# Patient Record
Sex: Female | Born: 1935 | Race: White | Hispanic: No | Marital: Married | Smoking: Current every day smoker
Health system: Southern US, Community
[De-identification: ages and names within clinical notes are randomized; demographics above are authoritative.]

## PROBLEM LIST (undated history)

## (undated) DIAGNOSIS — I6529 Occlusion and stenosis of unspecified carotid artery: Secondary | ICD-10-CM

## (undated) DIAGNOSIS — I639 Cerebral infarction, unspecified: Secondary | ICD-10-CM

## (undated) DIAGNOSIS — M8580 Other specified disorders of bone density and structure, unspecified site: Secondary | ICD-10-CM

## (undated) DIAGNOSIS — K219 Gastro-esophageal reflux disease without esophagitis: Secondary | ICD-10-CM

## (undated) DIAGNOSIS — R0602 Shortness of breath: Secondary | ICD-10-CM

## (undated) DIAGNOSIS — E785 Hyperlipidemia, unspecified: Secondary | ICD-10-CM

## (undated) DIAGNOSIS — I10 Essential (primary) hypertension: Secondary | ICD-10-CM

## (undated) DIAGNOSIS — J449 Chronic obstructive pulmonary disease, unspecified: Secondary | ICD-10-CM

## (undated) DIAGNOSIS — R51 Headache: Secondary | ICD-10-CM

## (undated) DIAGNOSIS — L309 Dermatitis, unspecified: Secondary | ICD-10-CM

## (undated) HISTORY — DX: Chronic obstructive pulmonary disease, unspecified: J44.9

## (undated) HISTORY — PX: CATARACT EXTRACTION: SUR2

## (undated) HISTORY — DX: Essential (primary) hypertension: I10

## (undated) HISTORY — PX: DILATION AND CURETTAGE OF UTERUS: SHX78

## (undated) HISTORY — DX: Dermatitis, unspecified: L30.9

## (undated) HISTORY — DX: Occlusion and stenosis of unspecified carotid artery: I65.29

## (undated) HISTORY — PX: CERVICAL FUSION: SHX112

## (undated) HISTORY — PX: ABDOMINAL HYSTERECTOMY: SHX81

## (undated) HISTORY — DX: Hyperlipidemia, unspecified: E78.5

## (undated) HISTORY — DX: Cerebral infarction, unspecified: I63.9

## (undated) HISTORY — PX: CATARACT EXTRACTION W/ INTRAOCULAR LENS  IMPLANT, BILATERAL: SHX1307

## (undated) HISTORY — DX: Gastro-esophageal reflux disease without esophagitis: K21.9

## (undated) HISTORY — PX: BREAST SURGERY: SHX581

## (undated) HISTORY — DX: Other specified disorders of bone density and structure, unspecified site: M85.80

---

## 2008-01-25 ENCOUNTER — Ambulatory Visit: Payer: Self-pay | Admitting: Gastroenterology

## 2008-01-26 ENCOUNTER — Ambulatory Visit (HOSPITAL_COMMUNITY): Admission: RE | Admit: 2008-01-26 | Discharge: 2008-01-26 | Payer: Self-pay | Admitting: Gastroenterology

## 2008-01-26 ENCOUNTER — Ambulatory Visit: Payer: Self-pay | Admitting: Gastroenterology

## 2008-01-26 ENCOUNTER — Encounter: Payer: Self-pay | Admitting: Gastroenterology

## 2010-03-05 ENCOUNTER — Encounter: Admission: RE | Admit: 2010-03-05 | Discharge: 2010-03-05 | Payer: Self-pay | Admitting: General Surgery

## 2010-04-08 ENCOUNTER — Ambulatory Visit (HOSPITAL_COMMUNITY): Admission: RE | Admit: 2010-04-08 | Discharge: 2010-04-08 | Payer: Self-pay | Admitting: General Surgery

## 2010-10-01 LAB — BASIC METABOLIC PANEL
BUN: 12 mg/dL (ref 6–23)
CO2: 29 mEq/L (ref 19–32)
GFR calc Af Amer: >60 mL/min (ref >60)
Glucose, Bld: 95 mg/dL (ref 70–99)
Potassium: 4 mEq/L (ref 3.5–5.1)

## 2010-10-01 LAB — CBC
MCHC: 33.6 g/dL (ref 30.0–36.0)
RBC: 4.84 MIL/uL (ref 3.87–5.11)
RDW: 14.5 % (ref 11.5–15.5)
WBC: 7.6 10*3/uL (ref 4.0–10.5)

## 2010-10-01 LAB — SURGICAL PCR SCREEN
MRSA, PCR: NEGATIVE
Staphylococcus aureus: NEGATIVE

## 2010-12-01 NOTE — Consult Note (Signed)
Virginia Fleming, Virginia Fleming             ACCOUNT NO.:  [id number]   MEDICAL RECORD NO.:  [id number]          PATIENT TYPE:  AMB   LOCATION:  DAY                           FACILITY:  [facility]   PHYSICIAN:  Kassie Mends, M.D.      DATE OF BIRTH:  Jun 06, 1933   DATE OF CONSULTATION:  01/25/2008  DATE OF DISCHARGE:                                 CONSULTATION   REQUESTING PHYSICIAN:  Dr. Sherryll Burger.   CHIEF COMPLAINT:  Rectal bleeding.   HISTORY OF PRESENT ILLNESS:  Virginia Fleming is a 77 year old Caucasian  female; 4 days ago, she awakened at 3 a.m. with a severe abdominal  aching pain and nausea.  She went to the bathroom and she became  diaphoretic and then she had cold and chills.  She had a large amount of  bright red blood, which she passed in the commode.  It continued on for  approximately 24 hours and she has not seen any further bleeding since.  She did not have a bowel movement during this time.  She denies any  NSAID use.  She continues to have some mild aching pain to her abdomen.  Denies any heartburn, indigestion, dysphagia, odynophagia, anorexia or  early satiety.  Denies any fever or chills.  Denies any history of  constipation or diarrhea.  Her weight has remained stable.   PAST MEDICAL AND SURGICAL HISTORY:  1. She has had multiple EGDs by Dr. Linna Darner and tells me that they have      all previously been normal with history of chronic GERD.  2. She had a colonoscopy by Dr. Linna Darner, which she believes was over 5      years or so ago and showed polyps.  3. She has had left wrist surgery, right breast lumpectomy, complete      hysterectomy, and cervical disk surgery.   CURRENT MEDICATIONS:  Kapadex 60 mg daily.   ALLERGIES:  PENICILLIN and CODEINE.   FAMILY HISTORY:  Positive for mother deceased with pancreatic cancer at  age 46.  Father deceased, secondary to prostate carcinoma.  She has 3  daughters with diabetes mellitus, 1 who had an history of peptic ulcer  disease, and 1 with  leukemia.  Two sisters, 1 with breast cancer and  heart disease.  Two sisters living, 1 deceased with heart disease as  well as a brother with the same.   SOCIAL HISTORY:  Virginia Fleming is married.  She has 4 daughters.  She is  retired from [employer].  She has a 40-year history of smoking  approximately of half-a-pack of cigarettes a day.  Denies any alcohol or  drug use.   REVIEW OF SYSTEMS:  See HPI, otherwise, negative.   PHYSICAL EXAMINATION:  VITAL SIGNS:  Weight 138.5 pounds, height 64  inches, temperature 98.2, and blood pressure 114/78, and pulse 80.  GENERAL:  She is a well-developed, well-nourished Caucasian female, in  no acute distress.  HEENT:  Sclerae clear, nonicteric.  Conjunctiva pink.  Oropharynx pink  and moist without lesions.  NECK:  Supple without mass or thyromegaly.  CHEST:  Heart regular  rhythm.  Normal S1-S2 without no murmurs, clicks,  rubs, or gallops.  ABDOMEN:  Positive bowel sounds x4.  No bruits auscultated.  Soft,  nontender, nondistended without palpable mass or hepatosplenomegaly  without any tenderness or guarding.  EXTREMITIES:  Without clubbing or edema bilaterally.  SKIN:  Pink, warm, and dry.   IMPRESSION:  Virginia Fleming is a 77 year old Caucasian female with acute  onset of abdominal pain and rectal bleeding, which I suspect this is due  to ischemic colitis.  Less likely would be diverticular bleeding or  colorectal carcinoma.  Benign anorectal source such as hemorrhoids or  fissures is a very unlikely given her presentation.  History of chronic  gastroesophageal reflux disease, well controlled on proton pump  inhibitor.   PLAN:  1. Colonoscopy with Dr. Cira Servant in the near future.  I discussed the      procedure including risks and benefits, which include but not      limited to bleeding, infection, perforation, and drug reaction.      She agrees with the plan and consent will be obtained.  2. The plan will be to continued Kapadex 60 mg  daily.   Thank you Dr. Sherryll Burger for allowing [place] to participate in the care of Virginia Fleming.      Lorenza Burton, N.P.      Kassie Mends, M.D.  Electronically Signed    KJ/MEDQ  D:  01/25/2008  T:  01/26/2008  Job:  [id number]   cc:   Kirstie Peri, MD  Fax: [phone]

## 2010-12-01 NOTE — Op Note (Signed)
[name], [name]             ACCOUNT NO.:  [id number]   MEDICAL RECORD NO.:  [id number]          PATIENT TYPE:  AMB   LOCATION:  DAY                           FACILITY:  [facility]   PHYSICIAN:  Virginia Fleming, M.D.      DATE OF BIRTH:  09-20-1932   DATE OF PROCEDURE:  01/26/2008  DATE OF DISCHARGE:                               OPERATIVE REPORT   REFERRING PHYSICIAN:  Kirstie Peri, MD   PROCEDURE:  Ileocolonoscopy with cold forceps biopsy and cold forceps  polypectomy.   INDICATION FOR EXAM:  Virginia Fleming is a 77 year old female who presented  with a sudden onset of abdominal pain followed by profuse rectal  bleeding on Sunday.  She has not seen any continued bleeding since.  She  is currently not having any abdominal pain.  She reports having a  colonoscopy 5 years ago, which showed polyps. She complains of pain in  her abdomen after eating frequently.  She denies any weight loss.   FINDINGS:  1. 3-mm ulcers seen in the sigmoid colon, ascending colon, and cecum.      Biopsies obtained via cold forceps to evaluate for inflammatory      bowel disease.  2. Area of erythema seen at the splenic flexure and with occasional      erosion.  Biopsies obtained via cold forceps to evaluate for      inflammatory bowel disease or ischemic colitis.  3. A 3-mm sigmoid colon polyp and 4-mm rectal polyp removed via cold      forceps.  4. Frequent sigmoid colon diverticula.  5. Small internal hemorrhoids.  Otherwise, no masses or AVMs.  6. Normal distal terminal ileum approximately 10-20 cm visualized.   DIAGNOSES:  1. Multiple colon ulcers likely secondary to prep artifact.  2. Erythema and erosion in the splenic flexure most likely secondary      to ischemic colitis in this patient, who has a history of tobacco      abuse.  3. Mild diverticulosis.  4. Small internal hemorrhoids.   RECOMMENDATIONS:  1. She should begin aspirin 81 mg daily on February 01, 2008.  She should      see her regular  doctor for ideal management for blood pressure and      cholesterol.  2. She should continue her Kapidex.  3. She should follow a high-fiber diet.  She is given a handout on      high-fiber diet, polyps, diverticulosis, and hemorrhoid.  4. No aspirin, NSAIDs, or anticoagulation for 5 days.  5. Follow up appointment with Dr. Cira Fleming in 6 weeks regarding her      abdominal pain.  6. Will call Virginia Fleming with results of her biopsies.  If her      biopsies are consistent with ischemic colitis, then she will need      either a CT scan of the abdomen and pelvis or a CT angiography of      the abdomen.   MEDICATIONS:  1. Demerol 50 mg IV.  2. Versed 4 mg IV.   PROCEDURE TECHNIQUE:  Physical exam was performed.  Informed consent was  obtained from the patient explaining the benefits, risks, and  alternatives to the procedure.  The patient was connected to the monitor  and placed in left lateral position.  Continuous oxygen was provided by  nasal cannula, IV medicine administered through an indwelling cannula.  After administration of sedation and rectal exam, the patient's rectum  was intubated, and the scope was advanced under direct visualization to  the distal terminal ileum.  The scope was removed slowly by carefully  examining the color, texture, anatomy, and integrity of the mucosa on  the way out.  The patient was recovered in endoscopy and discharged home  in satisfactory condition.   PATH:  Ulcers: Ischemic, IBD, or NSAID induced. Splenic flexure:  ischemic colitis. Hyperplastic polyps. Risk factor modification. Add  aspirin. Stop smoking. High fiber diet. TCS in 10 years.      Virginia Fleming, M.D.  Electronically Signed     SM/MEDQ  D:  01/26/2008  T:  01/27/2008  Job:  [id number]   cc:   Virginia Peri, MD  Fax: [phone]

## 2014-01-25 ENCOUNTER — Encounter: Payer: Self-pay | Admitting: Vascular Surgery

## 2014-01-25 ENCOUNTER — Other Ambulatory Visit: Payer: Self-pay

## 2014-01-25 DIAGNOSIS — Z01810 Encounter for preprocedural cardiovascular examination: Secondary | ICD-10-CM

## 2014-01-25 DIAGNOSIS — I6529 Occlusion and stenosis of unspecified carotid artery: Secondary | ICD-10-CM

## 2014-02-04 ENCOUNTER — Encounter: Payer: Self-pay | Admitting: Vascular Surgery

## 2014-02-05 ENCOUNTER — Encounter: Payer: Self-pay | Admitting: Vascular Surgery

## 2014-02-05 ENCOUNTER — Ambulatory Visit (HOSPITAL_COMMUNITY)
Admission: RE | Admit: 2014-02-05 | Discharge: 2014-02-05 | Disposition: A | Discharge status: Home / Self Care | Payer: Medicare Other | Source: Ambulatory Visit | Attending: Vascular Surgery | Admitting: Vascular Surgery

## 2014-02-05 ENCOUNTER — Ambulatory Visit (INDEPENDENT_AMBULATORY_CARE_PROVIDER_SITE_OTHER): Payer: Medicare Other | Admitting: Vascular Surgery

## 2014-02-05 VITALS — BP 142/79 | HR 84 | Resp 14 | Ht 63.0 in | Wt 137.0 lb

## 2014-02-05 DIAGNOSIS — Z01810 Encounter for preprocedural cardiovascular examination: Secondary | ICD-10-CM

## 2014-02-05 DIAGNOSIS — I6529 Occlusion and stenosis of unspecified carotid artery: Secondary | ICD-10-CM

## 2014-02-05 DIAGNOSIS — Z01818 Encounter for other preprocedural examination: Secondary | ICD-10-CM

## 2014-02-05 NOTE — Progress Notes (Signed)
[facility] [place] HISTORY AND PHYSICAL  Referring: Dr. Monico Blitz History of Present Illness:  Patient is a 80 y.o. year old female who presents for evaluation of symptomatic carotid stenosis. The patient had an uneventful in may of this year where she had approximately 30 minutes of right hand right leg weakness as well as speech difficulties. She drove herself to Dr. Trena Platt office after the symptoms resolved. She had a CT angiogram which showed 75% left internal carotid artery stenosis and 50% right internal carotid artery stenosis with bilateral vertebral artery moderate stenosis. She is currently on aspirin and Plavix. She denies any other prior TIA type episodes. She denies amaurosis. She denies prior stroke. She is a current smoker. Greater than 3 minutes they spent regarding smoking cessation counseling. Other medical problems include hyperlipidemia, hypertension, COPD, reflux all which are currently controlled. She did have a cardiac catheterization by Dr. Rex Kras several years ago which showed no obstructive coronary disease.  Past surgical history: Left cervical spine procedure through anterior approach  Past Medical History  Diagnosis Date  . Carotid artery occlusion   . Stroke   . Hyperlipidemia   . Hypertension   . COPD (chronic obstructive pulmonary disease)   . Gastroesophageal reflux disease   . Osteopenia   . Eczematous dermatitis     No past surgical history on file.  Social History History  Substance Use Topics  . Smoking status: Current Every Day Smoker  . Smokeless tobacco: Never Used  . Alcohol Use: No    Family History No family history on file.  Allergies  Allergies  Allergen Reactions  . Codeine   . Penicillins   . Propranolol Hcl   . Zithromax [Azithromycin]      Current Outpatient Prescriptions  Medication Sig Dispense Refill  . albuterol (ACCUNEB) 0.63 MG/3ML nebulizer solution Take 1 ampule by nebulization daily.       Marland Kitchen albuterol (PROVENTIL HFA;VENTOLIN HFA) 108 (90 BASE) MCG/ACT inhaler Inhale into the lungs every 6 (six) hours as needed for wheezing or shortness of breath.      Marland Kitchen aspirin 81 MG tablet Take 81 mg by mouth daily.      . clobetasol cream (TEMOVATE) 1.59 % Apply 1 application topically daily.      . clonazePAM (KLONOPIN) 0.5 MG tablet Take 0.5 mg by mouth daily.      . clopidogrel (PLAVIX) 75 MG tablet Take 75 mg by mouth daily.      . cyclobenzaprine (FLEXERIL) 10 MG tablet Take 10 mg by mouth 2 (two) times daily.      Marland Kitchen losartan (COZAAR) 50 MG tablet Take 50 mg by mouth daily. Take 1/2 tablet daily      . omeprazole (PRILOSEC) 20 MG capsule Take 20 mg by mouth daily.      . calcium carbonate (OS-CAL) 600 MG TABS tablet Take 600 mg by mouth 2 (two) times daily with a meal.       No current facility-administered medications for this visit.    ROS:   General:  No weight loss, Fever, chills  HEENT: No recent headaches, no nasal bleeding, no visual changes, no sore throat  Neurologic: No dizziness, blackouts, seizures.   Cardiac: + recent episodes of chest pain/pressure, no shortness of breath at rest.  + shortness of breath with exertion.  Denies history of atrial fibrillation or irregular heartbeat  Vascular: No history of rest pain in feet.  No history of claudication.  No history of non-healing  ulcer, No history of DVT   Pulmonary: No home oxygen, + productive cough, no hemoptysis,  No asthma or wheezing  Musculoskeletal:  [ ]  Arthritis, [ ]  Low back pain,  [ ]  Joint pain  Hematologic:No history of hypercoagulable state.  No history of easy bleeding.  No history of anemia  Gastrointestinal: No hematochezia or melena,  +gastroesophageal reflux, no trouble swallowing  Urinary: [ ]  chronic Kidney disease, [ ]  on HD - [ ]  MWF or [ ]  TTHS, [ ]  Burning with urination, [ ]  Frequent urination, [ ]  Difficulty urinating;   Skin: No rashes  Psychological: No history of anxiety,  No  history of depression   Physical Examination  Filed Vitals:   02/05/14 1121  BP: 142/79  Pulse: 84  Resp: 14  Height: 5\' 3"  (1.6 m)  Weight: 137 lb (62.143 kg)    Body mass index is 24.27 kg/(m^2).  General:  Alert and oriented, no acute distress HEENT: Normal Neck: No bruit or JVD, 3 cm transverse scar face left neck Pulmonary: Clear to auscultation bilaterally Cardiac: Regular Rate and Rhythm without murmur Abdomen: Soft, non-tender, non-distended, no mass Skin: No rash Extremity Pulses:  2+ radial, brachial, femoral, dorsalis pedis bilaterally Musculoskeletal: No deformity or edema  Neurologic: Upper and lower extremity motor 5/5 and symmetric, no facial asymmetry  DATA:  Carotid duplex scan was performed in our office today for operative planning purposes. I reviewed and interpreted this study. Left internal carotid artery stenosis of 60-80%.   ASSESSMENT:  Symptomatic left internal carotid artery stenosis   PLAN:  #1 continue antiplatelet agent Plavix/aspirin #2 cardiac risk stratification at [facility] #3 plan for left carotid endarterectomy after cardiac risk stratification  Risks benefits possible complications and procedure details of carotid endarterectomy were explained to the patient and her daughter today. Risk of stroke 1-2%. Risk of cranial nerve injury 10-15% among other possible complications. Patient understands and agrees to proceed.  Ruta Hinds, MD [facility] [place] Office: [phone] Pager: [phone]

## 2014-02-06 ENCOUNTER — Ambulatory Visit (INDEPENDENT_AMBULATORY_CARE_PROVIDER_SITE_OTHER): Payer: Medicare Other | Admitting: Cardiovascular Disease

## 2014-02-06 ENCOUNTER — Encounter: Payer: Self-pay | Admitting: *Deleted

## 2014-02-06 ENCOUNTER — Encounter: Payer: Self-pay | Admitting: Cardiovascular Disease

## 2014-02-06 VITALS — BP 120/78 | HR 78 | Ht 63.0 in

## 2014-02-06 DIAGNOSIS — I491 Atrial premature depolarization: Secondary | ICD-10-CM

## 2014-02-06 DIAGNOSIS — I10 Essential (primary) hypertension: Secondary | ICD-10-CM

## 2014-02-06 DIAGNOSIS — Z01810 Encounter for preprocedural cardiovascular examination: Secondary | ICD-10-CM

## 2014-02-06 DIAGNOSIS — I6529 Occlusion and stenosis of unspecified carotid artery: Secondary | ICD-10-CM

## 2014-02-06 DIAGNOSIS — Z01818 Encounter for other preprocedural examination: Secondary | ICD-10-CM

## 2014-02-06 NOTE — Assessment & Plan Note (Signed)
Well controlled.  Continue current medications and low sodium Dash type diet.    

## 2014-02-06 NOTE — Progress Notes (Signed)
Patient ID: Virginia Fleming, female   DOB: 1932-10-27, 80 y.o.   MRN: [id number]    80 yo previously seen by Dr Rex Kras  Normal cath in 70's  Sees Dr Brigitte Pulse in [place] for primary care  Recent TIA/CVA with high grade left carotid stenosis  Needs CEA with Dr Oneida Alar Has significant COPD and still smokes  No recent PFTls  Or CXR  No chest pain Activity limited by dyspnea  Seems under a lot of stress taking care of her husband with dementia No chest pain.  Has abnormal ECG with PAC;s  And low voltage.  CRF smoking and HTN with known vascular disease  On ASA and plavix since stroke.  Denies palpitations      ROS: Denies fever, malais, weight loss, blurry vision, decreased visual acuity, cough, sputum, SOB, hemoptysis, pleuritic pain, palpitaitons, heartburn, abdominal pain, melena, lower extremity edema, claudication, or rash.  All other systems reviewed and negative   General: Affect appropriate Chronically ill female  HEENT: normal Neck supple with no adenopathy JVP normal bilateral bruits no thyromegaly Lungs clear with no wheezing and good diaphragmatic motion Heart:  S1/S2 no murmur,rub, gallop or click PMI normal Abdomen: benighn, BS positve, no tenderness, no AAA no bruit.  No HSM or HJR Distal pulses intact with no bruits No edema Neuro non-focal Skin warm and dry No muscular weakness  Medications Current Outpatient Prescriptions  Medication Sig Dispense Refill  . albuterol (PROVENTIL HFA;VENTOLIN HFA) 108 (90 BASE) MCG/ACT inhaler Inhale into the lungs every 6 (six) hours as needed for wheezing or shortness of breath.      Marland Kitchen aspirin 81 MG tablet Take 81 mg by mouth daily.      . calcium carbonate (OS-CAL) 600 MG TABS tablet Take 600 mg by mouth 2 (two) times daily with a meal.      . clonazePAM (KLONOPIN) 0.5 MG tablet Take 0.5 mg by mouth daily.      . clopidogrel (PLAVIX) 75 MG tablet Take 75 mg by mouth daily.      . cyclobenzaprine (FLEXERIL) 10 MG tablet Take 10 mg by mouth  as needed.       Marland Kitchen losartan (COZAAR) 50 MG tablet Take 50 mg by mouth daily. Take 1/2 tablet daily      . omeprazole (PRILOSEC) 20 MG capsule Take 20 mg by mouth daily.       No current facility-administered medications for this visit.    Allergies Codeine; Penicillins; Propranolol hcl; and Zithromax  Family History: No family history on file.  Social History: History   Social History  . Marital Status: Married    Spouse Name: N/A    Number of Children: N/A  . Years of Education: N/A   Occupational History  . Not on file.   Social History Main Topics  . Smoking status: Current Every Day Smoker  . Smokeless tobacco: Never Used  . Alcohol Use: No  . Drug Use: No  . Sexual Activity: Not on file   Other Topics Concern  . Not on file   Social History Narrative  . No narrative on file    Electrocardiogram:  SR low voltage PAC;s   Assessment and Plan

## 2014-02-06 NOTE — Assessment & Plan Note (Signed)
Smoker with limited activity and known vascular disease abnormal ECG  Lexiscan myovue to r/o CAD

## 2014-02-06 NOTE — Assessment & Plan Note (Signed)
Asymptomatic  Increase risk of MAT / PAF  consier sympathomimetic beta blocker if she tolerates lexiscan She does have mild rhonchi and exp wheezing on exam

## 2014-02-06 NOTE — Assessment & Plan Note (Signed)
Continue ASA and Plavix  CEA if myovue looks ok  Consider preop CXR and PFTls as her COPD limits her more than anything

## 2014-02-06 NOTE — Patient Instructions (Signed)
Your physician recommends that you schedule a follow-up appointment in: As Needed  Your physician has requested that you have a lexiscan myoview. For further information please visit [email or web address]. Please follow instruction sheet, as given.    

## 2014-02-12 ENCOUNTER — Other Ambulatory Visit: Payer: Self-pay

## 2014-02-14 ENCOUNTER — Encounter (HOSPITAL_COMMUNITY)
Admission: RE | Admit: 2014-02-14 | Discharge: 2014-02-14 | Disposition: A | Discharge status: Home / Self Care | Payer: Medicare Other | Source: Ambulatory Visit | Attending: Cardiovascular Disease | Admitting: Cardiovascular Disease

## 2014-02-14 ENCOUNTER — Ambulatory Visit (HOSPITAL_COMMUNITY)
Admission: RE | Admit: 2014-02-14 | Discharge: 2014-02-14 | Disposition: A | Discharge status: Home / Self Care | Payer: Medicare Other | Source: Ambulatory Visit | Attending: Cardiovascular Disease | Admitting: Cardiovascular Disease

## 2014-02-14 ENCOUNTER — Encounter (HOSPITAL_COMMUNITY): Payer: Self-pay

## 2014-02-14 DIAGNOSIS — I6350 Cerebral infarction due to unspecified occlusion or stenosis of unspecified cerebral artery: Secondary | ICD-10-CM

## 2014-02-14 DIAGNOSIS — Z01810 Encounter for preprocedural cardiovascular examination: Secondary | ICD-10-CM

## 2014-02-14 DIAGNOSIS — Z01818 Encounter for other preprocedural examination: Secondary | ICD-10-CM | POA: Insufficient documentation

## 2014-02-14 MED ORDER — REGADENOSON 0.4 MG/5ML IV SOLN
INTRAVENOUS | Status: AC
  Administered 2014-02-14: 0.4 mg via INTRAVENOUS
  Filled 2014-02-14: qty 5

## 2014-02-14 MED ORDER — SODIUM CHLORIDE 0.9 % IJ SOLN
INTRAMUSCULAR | Status: AC
  Administered 2014-02-14: 10 mL via INTRAVENOUS
  Filled 2014-02-14: qty 10

## 2014-02-14 MED ORDER — TECHNETIUM TC 99M SESTAMIBI GENERIC - CARDIOLITE
30.0000 | Freq: Once | INTRAVENOUS | Status: AC | PRN
  Administered 2014-02-14: 30 via INTRAVENOUS

## 2014-02-14 MED ORDER — TECHNETIUM TC 99M SESTAMIBI - CARDIOLITE
10.0000 | Freq: Once | INTRAVENOUS | Status: AC | PRN
  Administered 2014-02-14: 10:00:00 10 via INTRAVENOUS

## 2014-02-14 MED ORDER — SODIUM CHLORIDE 0.9 % IJ SOLN
10.0000 mL | INTRAMUSCULAR | Status: DC | PRN
  Administered 2014-02-14: 10 mL via INTRAVENOUS

## 2014-02-14 MED ORDER — REGADENOSON 0.4 MG/5ML IV SOLN
0.4000 mg | Freq: Once | INTRAVENOUS | Status: AC | PRN
  Administered 2014-02-14: 0.4 mg via INTRAVENOUS

## 2014-02-14 NOTE — Progress Notes (Signed)
Stress Lab Nurses Notes - [place] 02/14/2014 Reason for doing test: Surgical Clearance Type of test: Wille Glaser Nurse performing test: Gerrit Halls, RN Nuclear Medicine Tech: Melburn Hake Echo Tech: Not Applicable MD performing test: S. McDowell/K.Purcell Nails NP Family MD: Manuella Ghazi Test explained and consent signed: Yes.   IV started: Saline lock flushed, No redness or edema and Saline lock started in radiology Symptoms: Nausea & headache Treatment/Intervention: None Reason test stopped: protocol completed After recovery IV was: Discontinued via X-ray tech and No redness or edema Patient to return to Nuc. Med at : 12:30 Patient discharged: Home Patient's Condition upon discharge was: stable Comments: During test BP 136/70 & HR 96.  Recovery BP 107/75 & HR 89.  Nausea resolved in recovery.  Continues to have headache. Geanie Cooley T

## 2014-02-15 ENCOUNTER — Encounter (HOSPITAL_COMMUNITY): Payer: Self-pay | Admitting: Pharmacy Technician

## 2014-02-18 ENCOUNTER — Encounter (HOSPITAL_COMMUNITY)
Admission: RE | Admit: 2014-02-18 | Discharge: 2014-02-18 | Disposition: A | Discharge status: Home / Self Care | Payer: Medicare Other | Source: Ambulatory Visit | Attending: Anesthesiology | Admitting: Anesthesiology

## 2014-02-18 ENCOUNTER — Encounter (HOSPITAL_COMMUNITY): Payer: Self-pay

## 2014-02-18 ENCOUNTER — Encounter (HOSPITAL_COMMUNITY)
Admission: RE | Admit: 2014-02-18 | Discharge: 2014-02-18 | Disposition: A | Discharge status: Home / Self Care | Payer: Medicare Other | Source: Ambulatory Visit | Attending: Vascular Surgery | Admitting: Vascular Surgery

## 2014-02-18 DIAGNOSIS — Z01818 Encounter for other preprocedural examination: Secondary | ICD-10-CM | POA: Diagnosis not present

## 2014-02-18 DIAGNOSIS — K219 Gastro-esophageal reflux disease without esophagitis: Secondary | ICD-10-CM | POA: Insufficient documentation

## 2014-02-18 DIAGNOSIS — Z8673 Personal history of transient ischemic attack (TIA), and cerebral infarction without residual deficits: Secondary | ICD-10-CM | POA: Diagnosis not present

## 2014-02-18 DIAGNOSIS — I10 Essential (primary) hypertension: Secondary | ICD-10-CM | POA: Insufficient documentation

## 2014-02-18 DIAGNOSIS — Z01812 Encounter for preprocedural laboratory examination: Secondary | ICD-10-CM | POA: Insufficient documentation

## 2014-02-18 DIAGNOSIS — F17200 Nicotine dependence, unspecified, uncomplicated: Secondary | ICD-10-CM | POA: Insufficient documentation

## 2014-02-18 DIAGNOSIS — J4489 Other specified chronic obstructive pulmonary disease: Secondary | ICD-10-CM | POA: Insufficient documentation

## 2014-02-18 DIAGNOSIS — E785 Hyperlipidemia, unspecified: Secondary | ICD-10-CM | POA: Insufficient documentation

## 2014-02-18 DIAGNOSIS — J449 Chronic obstructive pulmonary disease, unspecified: Secondary | ICD-10-CM | POA: Insufficient documentation

## 2014-02-18 HISTORY — DX: Headache: R51

## 2014-02-18 HISTORY — DX: Shortness of breath: R06.02

## 2014-02-18 LAB — PROTIME-INR
INR: 0.97 (ref 0.00–1.49)
PROTHROMBIN TIME: 12.9 s (ref 11.6–15.2)

## 2014-02-18 LAB — COMPREHENSIVE METABOLIC PANEL
ALBUMIN: 3.8 g/dL (ref 3.5–5.2)
ALT: 12 U/L (ref 0–35)
AST: 14 U/L (ref 0–37)
Alkaline Phosphatase: 67 U/L (ref 39–117)
Anion gap: 13 (ref 5–15)
BILIRUBIN TOTAL: 0.2 mg/dL — AB (ref 0.3–1.2)
BUN: 17 mg/dL (ref 6–23)
CHLORIDE: 105 meq/L (ref 96–112)
CO2: 25 mEq/L (ref 19–32)
CREATININE: 0.61 mg/dL (ref 0.50–1.10)
Calcium: 8.8 mg/dL (ref 8.4–10.5)
GFR calc Af Amer: >90 mL/min (ref >90)
GFR calc non Af Amer: 84 mL/min — ABNORMAL LOW (ref >90)
Glucose, Bld: 85 mg/dL (ref 70–99)
Potassium: 3.8 mEq/L (ref 3.7–5.3)
Sodium: 143 mEq/L (ref 137–147)
Total Protein: 6.9 g/dL (ref 6.0–8.3)

## 2014-02-18 LAB — CBC
HCT: 47 % — ABNORMAL HIGH (ref 36.0–46.0)
Hemoglobin: 15.1 g/dL — ABNORMAL HIGH (ref 12.0–15.0)
MCH: 30.6 pg (ref 26.0–34.0)
MCHC: 32.1 g/dL (ref 30.0–36.0)
MCV: 95.3 fL (ref 78.0–100.0)
PLATELETS: 130 10*3/uL — AB (ref 150–400)
RBC: 4.93 MIL/uL (ref 3.87–5.11)
RDW: 15.3 % (ref 11.5–15.5)
WBC: 5.9 10*3/uL (ref 4.0–10.5)

## 2014-02-18 LAB — URINALYSIS, ROUTINE W REFLEX MICROSCOPIC
BILIRUBIN URINE: NEGATIVE
Glucose, UA: NEGATIVE mg/dL
Hgb urine dipstick: NEGATIVE
Ketones, ur: NEGATIVE mg/dL
Leukocytes, UA: NEGATIVE
NITRITE: NEGATIVE
PROTEIN: NEGATIVE mg/dL
Specific Gravity, Urine: 1.02 (ref 1.005–1.030)
UROBILINOGEN UA: 0.2 mg/dL (ref 0.0–1.0)
pH: 6 (ref 5.0–8.0)

## 2014-02-18 LAB — APTT: aPTT: 24 seconds (ref 24–37)

## 2014-02-18 LAB — SURGICAL PCR SCREEN
MRSA, PCR: NEGATIVE
Staphylococcus aureus: NEGATIVE

## 2014-02-18 LAB — TYPE AND SCREEN
ABO/RH(D): A POS
ANTIBODY SCREEN: NEGATIVE

## 2014-02-18 LAB — ABO/RH: ABO/RH(D): A POS

## 2014-02-18 NOTE — Pre-Procedure Instructions (Addendum)
Virginia Fleming  02/18/2014   Your procedure is scheduled on: Monday, February 25, 2014 at 7:30 AM  Report to [facility] Admitting at 5;30 AM.  Call this number if you have problems the morning of surgery: [phone]   Remember:   Do not eat food or drink liquids after midnight Sunday February 24, 2014   Take these medicines the morning of surgery with A SIP OF WATER: Aspirin, Omeprazole (Prilosec),  If needed:Clonazepam (Klonopin) for anxiety,  albuterol (Proventil) for wheezing or shortness of breath  (bring with you the day of surgery).  Plavix as directed by your Dr.  Stop taking NSAID's ie: Ibuprofen, Advil,  Aleve, BC's, Goody's, Herbal medications, Fish Oil; stop now   Do not wear jewelry, make-up or nail polish.  Do not wear lotions, powders, or perfumes. You may NOT wear deodorant.  Do not shave 48 hours prior to surgery.  Do not bring valuables to the hospital.  [facility] is not responsible for any belongings or valuables.               Contacts, dentures or bridgework may not be worn into surgery.  Leave suitcase in the car. After surgery it may be brought to your room.  For patients admitted to the hospital, discharge time is determined by your treatment team.               Patients discharged the day of surgery will not be allowed to drive home.    Special Instructions: [facility] - Preparing for Surgery  Before surgery, you can play an important role.  Because skin is not sterile, your skin needs to be as free of germs as possible.  You can reduce the number of germs on you skin by washing with CHG (chlorahexidine gluconate) soap before surgery.  CHG is an antiseptic cleaner which kills germs and bonds with the skin to continue killing germs even after washing.  Please DO NOT use if you have an allergy to CHG or antibacterial soaps.  If your skin becomes reddened/irritated stop using the CHG and inform your nurse when you arrive at Short Stay.  Do not  shave (including legs and underarms) for at least 48 hours prior to the first CHG shower.  You may shave your face.  Please follow these instructions carefully:   1.  Shower with CHG Soap the night before surgery and the morning of Surgery.  2.  If you choose to wash your hair, wash your hair first as usual with your normal shampoo.  3.  After you shampoo, rinse your hair and body thoroughly to remove the Shampoo.  4.  Use CHG as you would any other liquid soap.  You can apply chg directly to the skin and wash gently with scrungie or a clean washcloth.  5.  Apply the CHG Soap to your body ONLY FROM THE NECK DOWN.  Do not use on open wounds or open sores.  Avoid contact with your eyes, ears, mouth and genitals (private parts).  Wash genitals (private parts) with your normal soap.  6.  Wash thoroughly, paying special attention to the area where your surgery will be performed.  7.  Thoroughly rinse your body with warm water from the neck down.  8.  DO NOT shower/wash with your normal soap after using and rinsing off the CHG Soap.  9.  Pat yourself dry with a clean towel.  10.  Wear clean pajamas.            11.  Place clean sheets on your bed the night of your first shower and do not sleep with pets.  Day of Surgery  Do not apply any lotions/deodorants the morning of surgery.  Please wear clean clothes to the hospital/surgery center.      Please read over the following fact sheets that you were given: Pain Booklet, Coughing and Deep Breathing, Blood Transfusion Information, MRSA Information and Surgical Site Infection Prevention

## 2014-02-18 NOTE — Progress Notes (Signed)
Pt currently denies being SOB (but suffers with SOB due to history of COPD) and chest pain. Pt was seen by Dr. Johnsie Cancel for surgical clearance. According to pt, she was instructed to stop Plavix on Tuesday, February 19, 2014. According to  Arbie Cookey, RN at Dr. Oneida Alar office,  pt should continue with Aspirin and take it the morning of procedure. Pt chart forwarded to [place], [place] ( anesthesia) to review for surgical clearance.

## 2014-02-19 NOTE — Progress Notes (Signed)
Anesthesia Chart Review:  Patient is a 80 year old female scheduled for left carotid endarterectomy on 02/25/14 by Dr. Oneida Alar. She had RLE weakness and dysarthria that lasted 30 minutes in 12/2013 with CTA of the neck showing 75% ICAS stenosis--confirmed with 88-41% LICAS by 6/60/63 duplex.  History includes smoking, HTN, HLD, CVA, COPD with chronic SOB, GERD, osteopenia, headaches, cervical fusion, hysterectomy. PCP is Dr. Monico Blitz in [place].  She saw cardiologist Dr. Johnsie Cancel for a preoperative evaluation on 02/06/14.  She had a low risk stress test.  By notes, I think he felt her COPD was her greater risk (over CV risk). Unfortunately, she continues to smoke.  She is not on home O2. She is on albuterol PRN.    Nuclear stress test on 02/14/14 showed: Low risk Lexiscan Cardiolite. There were no diagnostic ST segment  changes, rare PVCs noted. Patient tolerated Lexiscan infusion relatively well. There were no significant perfusion defects noted at stress to suggest scar or ischemia. LVEF calculated at 54% with normal volumes and wall motion.   EKG on 02/06/14 showed: SR with PACs, low voltage QRS.  CTA of the neck on 01/04/14 [facility]) showed: Diffuse atherosclerotic disease. 75% stenosis origin of the left internal carotid artery due to calcified plaque. 50% diameter stenosis proximal right internal carotid artery and 90% stenosis proximal right external carotid artery. Moderate stenosis of the vertebral artery bilaterally.  CXR on 02/18/14 showed: Cardiomediastinal silhouette is stable. No acute infiltrate or pleural effusion. No pulmonary edema. Chronic mild interstitial prominence and mild bronchitic changes. Mild degenerative changes mid thoracic spine. IMPRESSION: No active disease. Chronic mild interstitial prominence and central mild bronchitic changes.  Preoperative labs noted.   Further evaluation on the day of surgery to ensure no acute changes in her cardiopulmonary status.  She denied any current  SOB at her PAT visit. If no acute changes then I would anticipate that she could proceed as planned.  George Hugh [facility] Short Stay Center/Anesthesiology Phone [phone] 02/19/2014 11:58 AM

## 2014-02-24 MED ORDER — SODIUM CHLORIDE 0.9 % IV SOLN: INTRAVENOUS | Status: DC

## 2014-02-24 MED ORDER — VANCOMYCIN HCL IN DEXTROSE 1-5 GM/200ML-% IV SOLN
1000.0000 mg | INTRAVENOUS | Status: AC
  Administered 2014-02-25: 1000 mg via INTRAVENOUS
  Filled 2014-02-24: qty 200

## 2014-02-25 ENCOUNTER — Encounter (HOSPITAL_COMMUNITY): Payer: Self-pay | Admitting: *Deleted

## 2014-02-25 ENCOUNTER — Encounter (HOSPITAL_COMMUNITY)
Admission: RE | Disposition: A | Discharge status: Home / Self Care | Payer: Self-pay | Source: Ambulatory Visit | Attending: Vascular Surgery

## 2014-02-25 ENCOUNTER — Encounter (HOSPITAL_COMMUNITY): Payer: Medicare Other | Admitting: Vascular Surgery

## 2014-02-25 ENCOUNTER — Inpatient Hospital Stay (HOSPITAL_COMMUNITY)
Admission: RE | Admit: 2014-02-25 | Discharge: 2014-02-26 | DRG: 039 | Disposition: A | Discharge status: Home / Self Care | Payer: Medicare Other | Source: Ambulatory Visit | Attending: Vascular Surgery | Admitting: Vascular Surgery

## 2014-02-25 ENCOUNTER — Inpatient Hospital Stay (HOSPITAL_COMMUNITY): Payer: Medicare Other | Admitting: Certified Registered Nurse Anesthetist

## 2014-02-25 DIAGNOSIS — F17200 Nicotine dependence, unspecified, uncomplicated: Secondary | ICD-10-CM | POA: Diagnosis present

## 2014-02-25 DIAGNOSIS — I6521 Occlusion and stenosis of right carotid artery: Secondary | ICD-10-CM

## 2014-02-25 DIAGNOSIS — M899 Disorder of bone, unspecified: Secondary | ICD-10-CM | POA: Diagnosis present

## 2014-02-25 DIAGNOSIS — Z880 Allergy status to penicillin: Secondary | ICD-10-CM | POA: Diagnosis not present

## 2014-02-25 DIAGNOSIS — I10 Essential (primary) hypertension: Secondary | ICD-10-CM | POA: Diagnosis present

## 2014-02-25 DIAGNOSIS — Z881 Allergy status to other antibiotic agents status: Secondary | ICD-10-CM | POA: Diagnosis not present

## 2014-02-25 DIAGNOSIS — Z7902 Long term (current) use of antithrombotics/antiplatelets: Secondary | ICD-10-CM

## 2014-02-25 DIAGNOSIS — Z79899 Other long term (current) drug therapy: Secondary | ICD-10-CM

## 2014-02-25 DIAGNOSIS — J4489 Other specified chronic obstructive pulmonary disease: Secondary | ICD-10-CM | POA: Diagnosis present

## 2014-02-25 DIAGNOSIS — E785 Hyperlipidemia, unspecified: Secondary | ICD-10-CM | POA: Diagnosis present

## 2014-02-25 DIAGNOSIS — Z885 Allergy status to narcotic agent status: Secondary | ICD-10-CM

## 2014-02-25 DIAGNOSIS — M949 Disorder of cartilage, unspecified: Secondary | ICD-10-CM | POA: Diagnosis present

## 2014-02-25 DIAGNOSIS — Z888 Allergy status to other drugs, medicaments and biological substances status: Secondary | ICD-10-CM | POA: Diagnosis not present

## 2014-02-25 DIAGNOSIS — I6529 Occlusion and stenosis of unspecified carotid artery: Secondary | ICD-10-CM | POA: Diagnosis present

## 2014-02-25 DIAGNOSIS — K219 Gastro-esophageal reflux disease without esophagitis: Secondary | ICD-10-CM | POA: Diagnosis present

## 2014-02-25 DIAGNOSIS — Z7982 Long term (current) use of aspirin: Secondary | ICD-10-CM

## 2014-02-25 DIAGNOSIS — J449 Chronic obstructive pulmonary disease, unspecified: Secondary | ICD-10-CM | POA: Diagnosis not present

## 2014-02-25 HISTORY — PX: ENDARTERECTOMY: SHX5162

## 2014-02-25 SURGERY — ENDARTERECTOMY, CAROTID
Anesthesia: General | Site: Neck | Laterality: Left

## 2014-02-25 MED ORDER — CETYLPYRIDINIUM CHLORIDE 0.05 % MT LIQD
7.0000 mL | Freq: Two times a day (BID) | OROMUCOSAL | Status: DC
  Administered 2014-02-25 – 2014-02-26 (×2): 7 mL via OROMUCOSAL

## 2014-02-25 MED ORDER — CHLORHEXIDINE GLUCONATE CLOTH 2 % EX PADS: 6.0000 | MEDICATED_PAD | Freq: Once | CUTANEOUS | Status: DC

## 2014-02-25 MED ORDER — LACTATED RINGERS IV SOLN
INTRAVENOUS | Status: DC | PRN
Start: 2014-02-25 — End: 2014-02-25
  Administered 2014-02-25 (×2): via INTRAVENOUS

## 2014-02-25 MED ORDER — PROPOFOL 10 MG/ML IV BOLUS
INTRAVENOUS | Status: DC | PRN
  Administered 2014-02-25: 80 mg via INTRAVENOUS

## 2014-02-25 MED ORDER — PHENOL 1.4 % MT LIQD: 1.0000 | OROMUCOSAL | Status: DC | PRN

## 2014-02-25 MED ORDER — ROCURONIUM BROMIDE 50 MG/5ML IV SOLN
INTRAVENOUS | Status: AC
  Filled 2014-02-25: qty 1

## 2014-02-25 MED ORDER — SODIUM CHLORIDE 0.9 % IV SOLN: 500.0000 mL | Freq: Once | INTRAVENOUS | Status: AC | PRN

## 2014-02-25 MED ORDER — MIDAZOLAM HCL 2 MG/2ML IJ SOLN
INTRAMUSCULAR | Status: AC
  Filled 2014-02-25: qty 2

## 2014-02-25 MED ORDER — VANCOMYCIN HCL IN DEXTROSE 1-5 GM/200ML-% IV SOLN
1000.0000 mg | Freq: Once | INTRAVENOUS | Status: AC
  Administered 2014-02-25: 1000 mg via INTRAVENOUS
  Filled 2014-02-25: qty 200

## 2014-02-25 MED ORDER — DEXAMETHASONE SODIUM PHOSPHATE 4 MG/ML IJ SOLN
INTRAMUSCULAR | Status: AC
  Filled 2014-02-25: qty 2

## 2014-02-25 MED ORDER — ONDANSETRON HCL 4 MG/2ML IJ SOLN
INTRAMUSCULAR | Status: DC | PRN
Start: 2014-02-25 — End: 2014-02-25
  Administered 2014-02-25: 4 mg via INTRAVENOUS

## 2014-02-25 MED ORDER — FENTANYL CITRATE 0.05 MG/ML IJ SOLN
INTRAMUSCULAR | Status: AC
  Filled 2014-02-25: qty 5

## 2014-02-25 MED ORDER — DOPAMINE-DEXTROSE 3.2-5 MG/ML-% IV SOLN: 3.0000 ug/kg/min | INTRAVENOUS | Status: DC

## 2014-02-25 MED ORDER — MAGNESIUM SULFATE 40 MG/ML IJ SOLN: 2.0000 g | Freq: Every day | INTRAMUSCULAR | Status: DC | PRN

## 2014-02-25 MED ORDER — HYDROMORPHONE HCL PF 1 MG/ML IJ SOLN
INTRAMUSCULAR | Status: AC
  Filled 2014-02-25: qty 1

## 2014-02-25 MED ORDER — 0.9 % SODIUM CHLORIDE (POUR BTL) OPTIME
TOPICAL | Status: DC | PRN
  Administered 2014-02-25: 2000 mL

## 2014-02-25 MED ORDER — DEXTROSE-NACL 5-0.45 % IV SOLN
INTRAVENOUS | Status: DC
  Administered 2014-02-25: 50 mL/h via INTRAVENOUS

## 2014-02-25 MED ORDER — LIDOCAINE HCL 4 % MT SOLN
OROMUCOSAL | Status: DC | PRN
  Administered 2014-02-25: 4 mL via TOPICAL

## 2014-02-25 MED ORDER — OXYCODONE HCL 5 MG/5ML PO SOLN: 5.0000 mg | Freq: Once | ORAL | Status: DC | PRN

## 2014-02-25 MED ORDER — SODIUM CHLORIDE 0.9 % IR SOLN
Status: DC | PRN
  Administered 2014-02-25: 08:00:00

## 2014-02-25 MED ORDER — HYDROMORPHONE HCL PF 1 MG/ML IJ SOLN
0.2500 mg | INTRAMUSCULAR | Status: DC | PRN
  Administered 2014-02-25: 0.25 mg via INTRAVENOUS

## 2014-02-25 MED ORDER — LIDOCAINE HCL (PF) 1 % IJ SOLN
INTRAMUSCULAR | Status: AC
  Filled 2014-02-25: qty 30

## 2014-02-25 MED ORDER — HEPARIN SODIUM (PORCINE) 1000 UNIT/ML IJ SOLN
INTRAMUSCULAR | Status: AC
  Filled 2014-02-25: qty 1

## 2014-02-25 MED ORDER — PROPOFOL 10 MG/ML IV BOLUS
INTRAVENOUS | Status: AC
  Filled 2014-02-25: qty 20

## 2014-02-25 MED ORDER — NEOSTIGMINE METHYLSULFATE 10 MG/10ML IV SOLN
INTRAVENOUS | Status: AC
  Filled 2014-02-25: qty 2

## 2014-02-25 MED ORDER — HEPARIN SODIUM (PORCINE) 1000 UNIT/ML IJ SOLN
INTRAMUSCULAR | Status: DC | PRN
  Administered 2014-02-25: 3000 [IU] via INTRAVENOUS
  Administered 2014-02-25: 7000 [IU] via INTRAVENOUS

## 2014-02-25 MED ORDER — CALCIUM CARBONATE 600 MG PO TABS
600.0000 mg | ORAL_TABLET | Freq: Every day | ORAL | Status: DC
  Administered 2014-02-26: 600 mg via ORAL
  Filled 2014-02-25 (×2): qty 1

## 2014-02-25 MED ORDER — LOSARTAN POTASSIUM 50 MG PO TABS
50.0000 mg | ORAL_TABLET | Freq: Every day | ORAL | Status: DC
  Administered 2014-02-26: 50 mg via ORAL
  Filled 2014-02-25: qty 1

## 2014-02-25 MED ORDER — ROCURONIUM BROMIDE 100 MG/10ML IV SOLN
INTRAVENOUS | Status: DC | PRN
  Administered 2014-02-25: 15 mg via INTRAVENOUS
  Administered 2014-02-25: 35 mg via INTRAVENOUS

## 2014-02-25 MED ORDER — LIDOCAINE HCL (CARDIAC) 20 MG/ML IV SOLN
INTRAVENOUS | Status: DC | PRN
  Administered 2014-02-25: 60 mg via INTRAVENOUS

## 2014-02-25 MED ORDER — GUAIFENESIN-DM 100-10 MG/5ML PO SYRP: 15.0000 mL | ORAL_SOLUTION | ORAL | Status: DC | PRN

## 2014-02-25 MED ORDER — PHENYLEPHRINE HCL 10 MG/ML IJ SOLN
10.0000 mg | INTRAVENOUS | Status: DC | PRN
  Administered 2014-02-25: 20 ug/min via INTRAVENOUS

## 2014-02-25 MED ORDER — LIDOCAINE HCL (CARDIAC) 20 MG/ML IV SOLN
INTRAVENOUS | Status: AC
  Filled 2014-02-25: qty 10

## 2014-02-25 MED ORDER — ALBUTEROL SULFATE HFA 108 (90 BASE) MCG/ACT IN AERS: 2.0000 | INHALATION_SPRAY | Freq: Four times a day (QID) | RESPIRATORY_TRACT | Status: DC | PRN

## 2014-02-25 MED ORDER — LIDOCAINE HCL (PF) 2 % IJ SOLN
INTRAMUSCULAR | Status: DC | PRN
  Administered 2014-02-25: 2 mL

## 2014-02-25 MED ORDER — DOCUSATE SODIUM 100 MG PO CAPS
100.0000 mg | ORAL_CAPSULE | Freq: Every day | ORAL | Status: DC
  Administered 2014-02-26: 100 mg via ORAL
  Filled 2014-02-25: qty 1

## 2014-02-25 MED ORDER — PROTAMINE SULFATE 10 MG/ML IV SOLN
INTRAVENOUS | Status: DC | PRN
  Administered 2014-02-25 (×5): 10 mg via INTRAVENOUS

## 2014-02-25 MED ORDER — ACETAMINOPHEN 650 MG RE SUPP: 325.0000 mg | RECTAL | Status: DC | PRN

## 2014-02-25 MED ORDER — LABETALOL HCL 5 MG/ML IV SOLN
INTRAVENOUS | Status: AC
  Filled 2014-02-25: qty 4

## 2014-02-25 MED ORDER — STERILE WATER FOR INJECTION IJ SOLN
INTRAMUSCULAR | Status: AC
  Filled 2014-02-25: qty 10

## 2014-02-25 MED ORDER — ASPIRIN EC 325 MG PO TBEC
325.0000 mg | DELAYED_RELEASE_TABLET | Freq: Every day | ORAL | Status: DC
  Administered 2014-02-25 – 2014-02-26 (×2): 325 mg via ORAL
  Filled 2014-02-25 (×2): qty 1

## 2014-02-25 MED ORDER — FENTANYL CITRATE 0.05 MG/ML IJ SOLN
INTRAMUSCULAR | Status: DC | PRN
  Administered 2014-02-25 (×7): 50 ug via INTRAVENOUS

## 2014-02-25 MED ORDER — POTASSIUM CHLORIDE CRYS ER 20 MEQ PO TBCR: 20.0000 meq | EXTENDED_RELEASE_TABLET | Freq: Every day | ORAL | Status: DC | PRN

## 2014-02-25 MED ORDER — LABETALOL HCL 5 MG/ML IV SOLN
INTRAVENOUS | Status: DC | PRN
  Administered 2014-02-25: 5 mg via INTRAVENOUS

## 2014-02-25 MED ORDER — CYCLOBENZAPRINE HCL 10 MG PO TABS
10.0000 mg | ORAL_TABLET | Freq: Three times a day (TID) | ORAL | Status: DC | PRN
  Filled 2014-02-25: qty 1

## 2014-02-25 MED ORDER — PROTAMINE SULFATE 10 MG/ML IV SOLN
INTRAVENOUS | Status: AC
  Filled 2014-02-25: qty 5

## 2014-02-25 MED ORDER — PANTOPRAZOLE SODIUM 40 MG PO TBEC
40.0000 mg | DELAYED_RELEASE_TABLET | Freq: Every day | ORAL | Status: DC
  Administered 2014-02-25 – 2014-02-26 (×2): 40 mg via ORAL
  Filled 2014-02-25 (×2): qty 1

## 2014-02-25 MED ORDER — GLYCOPYRROLATE 0.2 MG/ML IJ SOLN
INTRAMUSCULAR | Status: DC | PRN
  Administered 2014-02-25: .4 mg via INTRAVENOUS

## 2014-02-25 MED ORDER — ACETAMINOPHEN 325 MG PO TABS: 325.0000 mg | ORAL_TABLET | ORAL | Status: DC | PRN

## 2014-02-25 MED ORDER — ALBUTEROL SULFATE (2.5 MG/3ML) 0.083% IN NEBU
2.5000 mg | INHALATION_SOLUTION | Freq: Four times a day (QID) | RESPIRATORY_TRACT | Status: DC | PRN
  Filled 2014-02-25: qty 3

## 2014-02-25 MED ORDER — ONDANSETRON HCL 4 MG/2ML IJ SOLN
INTRAMUSCULAR | Status: AC
  Filled 2014-02-25: qty 2

## 2014-02-25 MED ORDER — CLOPIDOGREL BISULFATE 75 MG PO TABS
75.0000 mg | ORAL_TABLET | Freq: Every day | ORAL | Status: DC
  Administered 2014-02-25 – 2014-02-26 (×2): 75 mg via ORAL
  Filled 2014-02-25 (×2): qty 1

## 2014-02-25 MED ORDER — MORPHINE SULFATE 2 MG/ML IJ SOLN
2.0000 mg | INTRAMUSCULAR | Status: DC | PRN
  Administered 2014-02-25: 2 mg via INTRAVENOUS
  Filled 2014-02-25: qty 1

## 2014-02-25 MED ORDER — PROMETHAZINE HCL 25 MG/ML IJ SOLN: 6.2500 mg | INTRAMUSCULAR | Status: DC | PRN

## 2014-02-25 MED ORDER — ASPIRIN 81 MG PO TABS
81.0000 mg | ORAL_TABLET | Freq: Every day | ORAL | Status: DC
  Filled 2014-02-25: qty 1

## 2014-02-25 MED ORDER — DEXAMETHASONE SODIUM PHOSPHATE 4 MG/ML IJ SOLN
INTRAMUSCULAR | Status: DC | PRN
  Administered 2014-02-25: 8 mg via INTRAVENOUS

## 2014-02-25 MED ORDER — EPHEDRINE SULFATE 50 MG/ML IJ SOLN
INTRAMUSCULAR | Status: AC
  Filled 2014-02-25: qty 1

## 2014-02-25 MED ORDER — ONDANSETRON HCL 4 MG/2ML IJ SOLN: 4.0000 mg | Freq: Four times a day (QID) | INTRAMUSCULAR | Status: DC | PRN

## 2014-02-25 MED ORDER — HYDRALAZINE HCL 20 MG/ML IJ SOLN: 10.0000 mg | INTRAMUSCULAR | Status: DC | PRN

## 2014-02-25 MED ORDER — OXYCODONE HCL 5 MG PO TABS: 5.0000 mg | ORAL_TABLET | Freq: Once | ORAL | Status: DC | PRN

## 2014-02-25 MED ORDER — NEOSTIGMINE METHYLSULFATE 10 MG/10ML IV SOLN
INTRAVENOUS | Status: DC | PRN
  Administered 2014-02-25: 3 mg via INTRAVENOUS

## 2014-02-25 MED ORDER — TRAMADOL HCL 50 MG PO TABS
50.0000 mg | ORAL_TABLET | Freq: Four times a day (QID) | ORAL | Status: DC | PRN
  Administered 2014-02-25: 50 mg via ORAL
  Filled 2014-02-25: qty 1

## 2014-02-25 MED ORDER — SUCCINYLCHOLINE CHLORIDE 20 MG/ML IJ SOLN
INTRAMUSCULAR | Status: AC
  Filled 2014-02-25: qty 1

## 2014-02-25 MED ORDER — ALUM & MAG HYDROXIDE-SIMETH 200-200-20 MG/5ML PO SUSP: 15.0000 mL | ORAL | Status: DC | PRN

## 2014-02-25 MED ORDER — DEXTROSE 5 % IV SOLN: 1.5000 g | Freq: Two times a day (BID) | INTRAVENOUS | Status: DC

## 2014-02-25 MED ORDER — LACTATED RINGERS IV SOLN
INTRAVENOUS | Status: DC | PRN
  Administered 2014-02-25: 07:00:00 via INTRAVENOUS

## 2014-02-25 SURGICAL SUPPLY — 55 items
ADH SKN CLS APL DERMABOND .7 (GAUZE/BANDAGES/DRESSINGS) ×1
BLADE 10 SAFETY STRL DISP (BLADE) ×2 IMPLANT
CANISTER SUCTION 2500CC (MISCELLANEOUS) ×2 IMPLANT
CANNULA VESSEL 3MM 2 BLNT TIP (CANNULA) ×2 IMPLANT
CATH ROBINSON RED A/P 18FR (CATHETERS) ×2 IMPLANT
CLIP TI MEDIUM 6 (CLIP) ×2 IMPLANT
CLIP TI WIDE RED SMALL 6 (CLIP) ×2 IMPLANT
COVER SURGICAL LIGHT HANDLE (MISCELLANEOUS) ×2 IMPLANT
CRADLE DONUT ADULT HEAD (MISCELLANEOUS) ×2 IMPLANT
DECANTER SPIKE VIAL GLASS SM (MISCELLANEOUS) IMPLANT
DERMABOND ADVANCED (GAUZE/BANDAGES/DRESSINGS) ×1
DERMABOND ADVANCED .7 DNX12 (GAUZE/BANDAGES/DRESSINGS) ×1 IMPLANT
DRAIN HEMOVAC 1/8 X 5 (WOUND CARE) IMPLANT
DRAPE WARM FLUID 44X44 (DRAPE) ×2 IMPLANT
ELECT REM PT RETURN 9FT ADLT (ELECTROSURGICAL) ×2
ELECTRODE REM PT RTRN 9FT ADLT (ELECTROSURGICAL) ×1 IMPLANT
EVACUATOR SILICONE 100CC (DRAIN) IMPLANT
GEL ULTRASOUND 20GR AQUASONIC (MISCELLANEOUS) IMPLANT
GLOVE BIO SURGEON STRL SZ7.5 (GLOVE) ×2 IMPLANT
GLOVE BIOGEL PI IND STRL 6.5 (GLOVE) IMPLANT
GLOVE BIOGEL PI IND STRL 7.0 (GLOVE) IMPLANT
GLOVE BIOGEL PI INDICATOR 6.5 (GLOVE) ×2
GLOVE BIOGEL PI INDICATOR 7.0 (GLOVE) ×2
GLOVE ECLIPSE 7.5 STRL STRAW (GLOVE) ×1 IMPLANT
GLOVE SURG SS PI 6.5 STRL IVOR (GLOVE) ×1 IMPLANT
GOWN STRL REUS W/ TWL LRG LVL3 (GOWN DISPOSABLE) ×3 IMPLANT
GOWN STRL REUS W/ TWL XL LVL3 (GOWN DISPOSABLE) IMPLANT
GOWN STRL REUS W/TWL LRG LVL3 (GOWN DISPOSABLE) ×6
GOWN STRL REUS W/TWL XL LVL3 (GOWN DISPOSABLE) ×8
KIT BASIN OR (CUSTOM PROCEDURE TRAY) ×2 IMPLANT
KIT ROOM TURNOVER OR (KITS) ×2 IMPLANT
KIT SHUNT ARGYLE CAROTID ART 6 (VASCULAR PRODUCTS) ×1 IMPLANT
LOOP VESSEL MINI RED (MISCELLANEOUS) IMPLANT
NDL HYPO 25GX1X1/2 BEV (NEEDLE) IMPLANT
NEEDLE HYPO 25GX1X1/2 BEV (NEEDLE) IMPLANT
NS IRRIG 1000ML POUR BTL (IV SOLUTION) ×4 IMPLANT
PACK CAROTID (CUSTOM PROCEDURE TRAY) ×2 IMPLANT
PAD ARMBOARD 7.5X6 YLW CONV (MISCELLANEOUS) ×4 IMPLANT
PATCH HEMASHIELD 8X150 (Vascular Products) ×1 IMPLANT
PROBE PENCIL 8 MHZ STRL DISP (MISCELLANEOUS) ×1 IMPLANT
SHUNT CAROTID BYPASS 10 (VASCULAR PRODUCTS) ×1 IMPLANT
SHUNT CAROTID BYPASS 12FRX15.5 (VASCULAR PRODUCTS) IMPLANT
SPONGE SURGIFOAM ABS GEL 100 (HEMOSTASIS) IMPLANT
SUT ETHILON 3 0 PS 1 (SUTURE) IMPLANT
SUT PROLENE 6 0 CC (SUTURE) ×3 IMPLANT
SUT PROLENE 7 0 BV 1 (SUTURE) IMPLANT
SUT SILK 3 0 TIES 17X18 (SUTURE)
SUT SILK 3-0 18XBRD TIE BLK (SUTURE) IMPLANT
SUT VIC AB 3-0 SH 27 (SUTURE) ×2
SUT VIC AB 3-0 SH 27X BRD (SUTURE) ×1 IMPLANT
SUT VICRYL 4-0 PS2 18IN ABS (SUTURE) ×2 IMPLANT
SYR CONTROL 10ML LL (SYRINGE) IMPLANT
TOWEL OR 17X24 6PK STRL BLUE (TOWEL DISPOSABLE) ×2 IMPLANT
TOWEL OR 17X26 10 PK STRL BLUE (TOWEL DISPOSABLE) ×2 IMPLANT
WATER STERILE IRR 1000ML POUR (IV SOLUTION) ×2 IMPLANT

## 2014-02-25 NOTE — H&P (View-Only) (Signed)
[facility] [place] HISTORY AND PHYSICAL  Referring: Dr. Monico Blitz History of Present Illness:  Patient is a 80 y.o. year old female who presents for evaluation of symptomatic carotid stenosis. The patient had an uneventful in may of this year where she had approximately 30 minutes of right hand right leg weakness as well as speech difficulties. She drove herself to Dr. Trena Platt office after the symptoms resolved. She had a CT angiogram which showed 75% left internal carotid artery stenosis and 50% right internal carotid artery stenosis with bilateral vertebral artery moderate stenosis. She is currently on aspirin and Plavix. She denies any other prior TIA type episodes. She denies amaurosis. She denies prior stroke. She is a current smoker. Greater than 3 minutes they spent regarding smoking cessation counseling. Other medical problems include hyperlipidemia, hypertension, COPD, reflux all which are currently controlled. She did have a cardiac catheterization by Dr. Rex Kras several years ago which showed no obstructive coronary disease.  Past surgical history: Left cervical spine procedure through anterior approach  Past Medical History  Diagnosis Date  . Carotid artery occlusion   . Stroke   . Hyperlipidemia   . Hypertension   . COPD (chronic obstructive pulmonary disease)   . Gastroesophageal reflux disease   . Osteopenia   . Eczematous dermatitis     No past surgical history on file.  Social History History  Substance Use Topics  . Smoking status: Current Every Day Smoker  . Smokeless tobacco: Never Used  . Alcohol Use: No    Family History No family history on file.  Allergies  Allergies  Allergen Reactions  . Codeine   . Penicillins   . Propranolol Hcl   . Zithromax [Azithromycin]      Current Outpatient Prescriptions  Medication Sig Dispense Refill  . albuterol (ACCUNEB) 0.63 MG/3ML nebulizer solution Take 1 ampule by nebulization daily.       Marland Kitchen albuterol (PROVENTIL HFA;VENTOLIN HFA) 108 (90 BASE) MCG/ACT inhaler Inhale into the lungs every 6 (six) hours as needed for wheezing or shortness of breath.      Marland Kitchen aspirin 81 MG tablet Take 81 mg by mouth daily.      . clobetasol cream (TEMOVATE) 7.82 % Apply 1 application topically daily.      . clonazePAM (KLONOPIN) 0.5 MG tablet Take 0.5 mg by mouth daily.      . clopidogrel (PLAVIX) 75 MG tablet Take 75 mg by mouth daily.      . cyclobenzaprine (FLEXERIL) 10 MG tablet Take 10 mg by mouth 2 (two) times daily.      Marland Kitchen losartan (COZAAR) 50 MG tablet Take 50 mg by mouth daily. Take 1/2 tablet daily      . omeprazole (PRILOSEC) 20 MG capsule Take 20 mg by mouth daily.      . calcium carbonate (OS-CAL) 600 MG TABS tablet Take 600 mg by mouth 2 (two) times daily with a meal.       No current facility-administered medications for this visit.    ROS:   General:  No weight loss, Fever, chills  HEENT: No recent headaches, no nasal bleeding, no visual changes, no sore throat  Neurologic: No dizziness, blackouts, seizures.   Cardiac: + recent episodes of chest pain/pressure, no shortness of breath at rest.  + shortness of breath with exertion.  Denies history of atrial fibrillation or irregular heartbeat  Vascular: No history of rest pain in feet.  No history of claudication.  No history of non-healing  ulcer, No history of DVT   Pulmonary: No home oxygen, + productive cough, no hemoptysis,  No asthma or wheezing  Musculoskeletal:  [ ]  Arthritis, [ ]  Low back pain,  [ ]  Joint pain  Hematologic:No history of hypercoagulable state.  No history of easy bleeding.  No history of anemia  Gastrointestinal: No hematochezia or melena,  +gastroesophageal reflux, no trouble swallowing  Urinary: [ ]  chronic Kidney disease, [ ]  on HD - [ ]  MWF or [ ]  TTHS, [ ]  Burning with urination, [ ]  Frequent urination, [ ]  Difficulty urinating;   Skin: No rashes  Psychological: No history of anxiety,  No  history of depression   Physical Examination  Filed Vitals:   02/05/14 1121  BP: 142/79  Pulse: 84  Resp: 14  Height: 5\' 3"  (1.6 m)  Weight: 137 lb (62.143 kg)    Body mass index is 24.27 kg/(m^2).  General:  Alert and oriented, no acute distress HEENT: Normal Neck: No bruit or JVD, 3 cm transverse scar face left neck Pulmonary: Clear to auscultation bilaterally Cardiac: Regular Rate and Rhythm without murmur Abdomen: Soft, non-tender, non-distended, no mass Skin: No rash Extremity Pulses:  2+ radial, brachial, femoral, dorsalis pedis bilaterally Musculoskeletal: No deformity or edema  Neurologic: Upper and lower extremity motor 5/5 and symmetric, no facial asymmetry  DATA:  Carotid duplex scan was performed in our office today for operative planning purposes. I reviewed and interpreted this study. Left internal carotid artery stenosis of 60-80%.   ASSESSMENT:  Symptomatic left internal carotid artery stenosis   PLAN:  #1 continue antiplatelet agent Plavix/aspirin #2 cardiac risk stratification at [facility] #3 plan for left carotid endarterectomy after cardiac risk stratification  Risks benefits possible complications and procedure details of carotid endarterectomy were explained to the patient and her daughter today. Risk of stroke 1-2%. Risk of cranial nerve injury 10-15% among other possible complications. Patient understands and agrees to proceed.  Ruta Hinds, MD [facility] [place] Office: [phone] Pager: [phone]

## 2014-02-25 NOTE — Anesthesia Preprocedure Evaluation (Addendum)
Anesthesia Evaluation  Patient identified by MRN, date of birth, ID band Patient awake    Reviewed: Allergy & Precautions, H&P , NPO status , Patient's Chart, lab work & pertinent test results  History of Anesthesia Complications Negative for: history of anesthetic complications  Airway Mallampati: I TM Distance: >3 FB Neck ROM: Full    Dental  (+) Teeth Intact, Dental Advisory Given   Pulmonary shortness of breath, COPDCurrent Smoker,  breath sounds clear to auscultation        Cardiovascular hypertension, + Peripheral Vascular Disease Rhythm:Regular Rate:Normal     Neuro/Psych CVA    GI/Hepatic GERD-  ,  Endo/Other    Renal/GU      Musculoskeletal   Abdominal   Peds  Hematology   Anesthesia Other Findings   Reproductive/Obstetrics                          Anesthesia Physical Anesthesia Plan  ASA: III  Anesthesia Plan: General   Post-op Pain Management:    Induction: Intravenous  Airway Management Planned: Oral ETT  Additional Equipment: Arterial line  Intra-op Plan:   Post-operative Plan: Extubation in OR  Informed Consent: I have reviewed the patients History and Physical, chart, labs and discussed the procedure including the risks, benefits and alternatives for the proposed anesthesia with the patient or authorized representative who has indicated his/her understanding and acceptance.   Dental advisory given  Plan Discussed with: CRNA and Surgeon  Anesthesia Plan Comments:         Anesthesia Quick Evaluation

## 2014-02-25 NOTE — Anesthesia Postprocedure Evaluation (Signed)
  Anesthesia Post-op Note  Patient: Virginia Fleming  Procedure(s) Performed: Procedure(s): LEFT CAROTID ARTERY  ENDARTERECTOMY WITH DACRON PATCH ANGIOPLASTY (Left)  Patient Location: PACU  Anesthesia Type:General  Level of Consciousness: awake and alert   Airway and Oxygen Therapy: Patient Spontanous Breathing  Post-op Pain: mild  Post-op Assessment: Post-op Vital signs reviewed  Post-op Vital Signs: stable  Last Vitals:  Filed Vitals:   02/25/14 1200  BP:   Pulse: 70  Temp:   Resp: 13    Complications: No apparent anesthesia complications

## 2014-02-25 NOTE — Op Note (Signed)
Procedure Left carotid endarterectomy  Preoperative diagnosis: High-grade symptomatic left internal carotid artery stenosis  Postoperative diagnosis: Same  Anesthesia General  Asst.: Carlyn Reichert RNFA  Operative findings: #1 greater than 80% left internal carotid stenosis                                                             #2 Dacron patch           #3 10 Fr shunt  Operative details: After obtaining informed consent, the patient was taken to the operating room. The patient was placed in a supine position on the operating room table. After induction of general anesthesia and endotracheal intubation a Foley catheter was placed. Next the patient's entire left neck and chest were prepped and draped in the usual sterile fashion. An oblique incision was made on the left aspect of the patient's neck anterior to the border the left sternocleidomastoid muscle. The incision was carried into the subcutaneous tissues and through the platysma. The sternocleidomastoid muscle was identified and reflected laterally. The omohyoid muscle was identified and this was divided with cautery. The common carotid artery was then found at the base of the incision this was dissected free circumferentially.  There was calcified plaque in the distal common carotid so I dissected the artery free below the level of the omohyoid mucle.  It was fairly soft on palpation at this level approximately 4-5 cm below the carotid bifurcation.  The vagus nerve was identified and protected. Dissection was then carried up to the level carotid bifurcation.   The hyperglossal nerve was well above the primary area of dissection. The internal carotid artery was dissected free circumferentially just below the level of the hypoglossal nerve and it was soft in character at this location and above any palpable disease. A vessel loop was placed around this. Next the external carotid and superior thyroid arteries were dissected free circumferentially  and vessel loops were placed around these. The patient was given 7000 units of intravenous heparin.  After 2 minutes of circulation time and raising the mean arterial pressure to 90 mm mercury, the distal internal carotid artery was controlled with small bulldog clamp. The external carotid and superior thyroid arteries were controlled with vessel loops. The common carotid artery was controlled with a peripheral DeBakey clamp. A longitudinal opening was made in the common carotid artery just below the bifurcation. The arteriotomy was extended distally up into the internal carotid with Potts scissors. There was a large calcified plaque with greater than 80% stenosis in the internal carotid.   A 10 Fr shunt was threaded into the distal internal carotid artery and allowed to backbleed thoroughly.  There was pulsatile backbleeding.  This was then threaded into the common carotid and secured with a Rummel tourniquet. There was no air at this point and flow was restored to the brain.  Attention was then turned to the common carotid artery once again. A suitable endarterectomy plane was obtained and endarterectomy was begun in the common carotid artery and a good proximal endpoint was obtained. An eversion endarterectomy was performed on the external carotid artery and a good endpoint was obtained. The plaque was then elevated in the internal carotid artery and a nice feathered distal endpoint was also obtained.  The plaque was passed off  the table. All loose debris was then removed from the carotid bed and everything was thoroughly irrigated with heparinized saline. A Dacron patch was then brought on to the operative field and this was sewn on as a patch angioplasty using a running 6-0 Prolene suture. Prior to completion of the anastomosis the internal carotid artery was thoroughly backbled. This was then controlled again with a fine bulldog clamp.  The common carotid was thoroughly flushed forward. The external carotid  was also thoroughly backbled.  The remainder of the patch was completed and the anastomosis was secured. Flow was then restored first retrograde from the external carotid into the carotid bed then antegrade from the common carotid to the external carotid artery and after approximately 5 cardiac cycles to the internal carotid artery. Doppler was used to evaluate the external/internal and common carotid arteries and these all had good Doppler flow. Hemostasis was obtained with 1 additional repair suture. The patient was also given 50 mg of Protamine.      The platysma muscle was reapproximated using a running 3-0 Vicryl suture. The skin was closed with 4 0 Vicryl subcuticular stitch.  The patient was awakened in the operating room and was moving upper and lower extremities symmetrically and following commands.  The patient was stable on arrival to the PACU.  Ruta Hinds, MD [facility] [place] Office: [phone] Pager: [phone]

## 2014-02-25 NOTE — Transfer of Care (Signed)
Immediate Anesthesia Transfer of Care Note  Patient: Virginia Fleming  Procedure(s) Performed: Procedure(s): LEFT CAROTID ARTERY  ENDARTERECTOMY WITH DACRON PATCH ANGIOPLASTY (Left)  Patient Location: PACU  Anesthesia Type:General  Level of Consciousness: awake and alert   Airway & Oxygen Therapy: Patient Spontanous Breathing and Patient connected to nasal cannula oxygen  Post-op Assessment: Report given to PACU RN, Post -op Vital signs reviewed and stable and Patient moving all extremities X 4  Post vital signs: Reviewed and stable  Complications: No apparent anesthesia complications

## 2014-02-25 NOTE — Interval H&P Note (Signed)
History and Physical Interval Note:  02/25/2014 7:31 AM  Virginia Fleming  has presented today for surgery, with the diagnosis of Symptomatic left internal carotid artery stenosis  The various methods of treatment have been discussed with the patient and family. After consideration of risks, benefits and other options for treatment, the patient has consented to  Procedure(s): LEFT CAROTID ARTERY  ENDARTERECTOMY (Left) as a surgical intervention .  The patient's history has been reviewed, patient examined, no change in status, stable for surgery.  I have reviewed the patient's chart and labs.  Questions were answered to the patient's satisfaction.     FIELDS,CHARLES E

## 2014-02-25 NOTE — Anesthesia Procedure Notes (Signed)
Procedure Name: Intubation Date/Time: 02/25/2014 7:59 AM Performed by: Blair Heys E Pre-anesthesia Checklist: Patient identified, Emergency Drugs available, Suction available and Patient being monitored Patient Re-evaluated:Patient Re-evaluated prior to inductionOxygen Delivery Method: Circle system utilized Preoxygenation: Pre-oxygenation with 100% oxygen Intubation Type: IV induction Ventilation: Mask ventilation without difficulty Laryngoscope Size: Miller and 2 Grade View: Grade I Tube type: Oral Tube size: 7.5 mm Number of attempts: 1 Airway Equipment and Method: Stylet and LTA kit utilized Placement Confirmation: ETT inserted through vocal cords under direct vision,  positive ETCO2 and breath sounds checked- equal and bilateral Secured at: 21 cm Tube secured with: Tape Dental Injury: Teeth and Oropharynx as per pre-operative assessment

## 2014-02-26 ENCOUNTER — Telehealth: Payer: Self-pay | Admitting: Vascular Surgery

## 2014-02-26 LAB — CBC
HEMATOCRIT: 39.1 % (ref 36.0–46.0)
HEMOGLOBIN: 12.6 g/dL (ref 12.0–15.0)
MCH: 31.3 pg (ref 26.0–34.0)
MCHC: 32.2 g/dL (ref 30.0–36.0)
MCV: 97 fL (ref 78.0–100.0)
Platelets: 112 10*3/uL — ABNORMAL LOW (ref 150–400)
RBC: 4.03 MIL/uL (ref 3.87–5.11)
RDW: 15 % (ref 11.5–15.5)
WBC: 11.7 10*3/uL — AB (ref 4.0–10.5)

## 2014-02-26 LAB — BASIC METABOLIC PANEL
Anion gap: 10 (ref 5–15)
BUN: 11 mg/dL (ref 6–23)
CHLORIDE: 107 meq/L (ref 96–112)
CO2: 26 mEq/L (ref 19–32)
Calcium: 7.9 mg/dL — ABNORMAL LOW (ref 8.4–10.5)
Creatinine, Ser: 0.47 mg/dL — ABNORMAL LOW (ref 0.50–1.10)
Glucose, Bld: 110 mg/dL — ABNORMAL HIGH (ref 70–99)
POTASSIUM: 4.1 meq/L (ref 3.7–5.3)
SODIUM: 143 meq/L (ref 137–147)

## 2014-02-26 MED ORDER — TRAMADOL HCL 50 MG PO TABS: 50.0000 mg | ORAL_TABLET | Freq: Four times a day (QID) | ORAL | Status: DC | PRN

## 2014-02-26 NOTE — Telephone Encounter (Signed)
Unable to leave message as family member stated that she was not home and wasn't expected back until after 5. Mailed appointment letter, dpm

## 2014-02-26 NOTE — Progress Notes (Signed)
Family and belongings at bedside, VS stable at time of discharge, no c/o pain. Assessment unchanged. Discharge instructions and paper prescription slip given to pt. Pt d/c'd to home with daughter via wheelchair, PIVs removed, glasses with pt daughter, dressed in clothes provided by daughter. No questions or complaints at this time.  Kiwan Gadsden L

## 2014-02-26 NOTE — Progress Notes (Signed)
  [facility] Progress Note  02/26/2014 7:24 AM 1 Day Post-Op  Subjective:  Doing well this am. Denies pain. No problems swallowing.  Sitting up in chair.   Tmax 98.5 BP sys 80s-120s 02 98% 2L  Filed Vitals:   02/26/14 0700  BP: 120/55  Pulse: 82  Temp:   Resp: 20     Physical Exam: Neuro: no smile asymmetry. No tongue deviation. 5/5 grip strength bilaterally. Moving all extremities equally.  Incision:  Incision clean dry and intact. No hematoma.   CBC    Component Value Date/Time   WBC 11.7* 02/26/2014 0500   RBC 4.03 02/26/2014 0500   HGB 12.6 02/26/2014 0500   HCT 39.1 02/26/2014 0500   PLT 112* 02/26/2014 0500   MCV 97.0 02/26/2014 0500   MCH 31.3 02/26/2014 0500   MCHC 32.2 02/26/2014 0500   RDW 15.0 02/26/2014 0500    BMET    Component Value Date/Time   NA 143 02/26/2014 0500   K 4.1 02/26/2014 0500   CL 107 02/26/2014 0500   CO2 26 02/26/2014 0500   GLUCOSE 110* 02/26/2014 0500   BUN 11 02/26/2014 0500   CREATININE 0.47* 02/26/2014 0500   CALCIUM 7.9* 02/26/2014 0500   GFRNONAA >90 02/26/2014 0500   GFRAA >90 02/26/2014 0500     Intake/Output Summary (Last 24 hours) at 02/26/14 0724 Last data filed at 02/26/14 0600  Gross per 24 hour  Intake 2579.17 ml  Output   2200 ml  Net 379.17 ml      Assessment/Plan:  This is a 80 y.o. female who is s/p right CEA 1 Day Post-Op  -Pt is doing well this am.  -Pt neuro exam is in tact. -Pt has not ambulated. Please ambulate. -Pt has voided -Discharge later today.   Virgina Jock, PA-C [facility] Office: [phone] Pager: [phone] 02/26/2014 7:24 AM    Neuro intact no hematoma Will d/c home today.  Ruta Hinds, MD [facility] [place] Office: [phone] Pager: [phone]

## 2014-02-26 NOTE — Progress Notes (Signed)
Pt ambulated x 331ft on ra, SATS remained >92, tolerated well. Brachial aline removed prior to ambulation. No complaints at this time. Family at bedside. Carrel Leather L

## 2014-02-26 NOTE — Telephone Encounter (Signed)
Message copied by Gena Fray on Tue Feb 26, 2014  2:42 PM ------      Message from: Mena Goes      Created: Tue Feb 26, 2014  8:31 AM      Regarding: Schedule                   ----- Message -----         From: Alvia Grove, PA-C         Sent: 02/26/2014   7:30 AM           To: [facility] Charge Pool            S/p left CEA 02/25/14            F/u wth Dr. Oneida Alar in 2 weeks.            Thanks,      Maudie Mercury ------

## 2014-02-26 NOTE — Discharge Summary (Signed)
[facility] Discharge Summary  Virginia Fleming [birth year] 80 y.o. female  [id number]  Admission Date: 02/25/2014  Discharge Date: 02/26/2014  Physician: Elam Dutch, MD  Admission Diagnosis: Occlusion and stenosis of carotid artery without mention of cerebral infarction    HPI:   This is a 80 y.o. female who presented to the [facility] office on 02/05/14 for evaluation of symptomatic carotid stenosis. The patient had an event in May of this year where she had approximately 30 minutes of right hand right leg weakness as well as speech difficulties. She drove herself to Dr. Trena Platt office after the symptoms resolved. She had a CT angiogram which showed 75% left internal carotid artery stenosis and 50% right internal carotid artery stenosis with bilateral vertebral artery moderate stenosis. She is currently on aspirin and Plavix. She denies any other prior TIA type episodes. She denies amaurosis. She denies prior stroke. She is a current smoker. Greater than 3 minutes they spent regarding smoking cessation counseling. Other medical problems include hyperlipidemia, hypertension, COPD, reflux all which are currently controlled. She did have a cardiac catheterization by Dr. Rex Kras several years ago which showed no obstructive coronary disease.   Hospital Course:  The patient was admitted to the hospital and taken to the operating room on 02/25/2014 and underwent left carotid endarterectomy.  The patient tolerated the procedure well and was transported to the PACU in stable condition.   By POD 1, the patient's neuro status was intact. She was able to void without difficulty. Her incision was intact with no hematoma. The remainder of the hospital course consisted of increasing mobilization and increasing intake of solids without difficulty. She was discharged home on POD 1 in good condition.     Recent Labs  02/26/14 0500  NA 143  K 4.1  CL 107  CO2 26  GLUCOSE 110*  BUN  11  CALCIUM 7.9*    Recent Labs  02/26/14 0500  WBC 11.7*  HGB 12.6  HCT 39.1  PLT 112*   No results found for this basename: INR,  in the last 72 hours  Discharge Instructions:   The patient is discharged to home with extensive instructions on wound care and progressive ambulation.  They are instructed not to drive or perform any heavy lifting until returning to see the physician in his office.  Discharge Instructions   CAROTID Sugery: Call MD for difficulty swallowing or speaking; weakness in arms or legs that is a new symtom; severe headache.  If you have increased swelling in the neck and/or  are having difficulty breathing, CALL [phone]    Complete by:  As directed      Call MD for:  redness, tenderness, or signs of infection (pain, swelling, bleeding, redness, odor or green/yellow discharge around incision site)    Complete by:  As directed      Call MD for:  severe or increased pain, loss or decreased feeling  in affected limb(s)    Complete by:  As directed      Call MD for:  temperature >100.5    Complete by:  As directed      Driving Restrictions    Complete by:  As directed   No driving for 2 weeks     Lifting restrictions    Complete by:  As directed   No lifting for 2 weeks     Resume previous diet    Complete by:  As directed      may wash  over wound with mild soap and water    Complete by:  As directed            Discharge Diagnosis:  Occlusion and stenosis of carotid artery without mention of cerebral infarction   Secondary Diagnosis: Patient Active Problem List   Diagnosis Date Noted  . Carotid artery stenosis, symptomatic 02/25/2014  . Preop cardiovascular exam 02/06/2014  . HTN (hypertension) 02/06/2014  . PAC (premature atrial contraction) 02/06/2014  . Occlusion and stenosis of carotid artery without mention of cerebral infarction 02/05/2014   Past Medical History  Diagnosis Date  . Carotid artery occlusion   . Stroke   . Hyperlipidemia   .  Hypertension   . COPD (chronic obstructive pulmonary disease)   . Gastroesophageal reflux disease   . Osteopenia   . Eczematous dermatitis   . Shortness of breath   . Headache(784.0)       Medication List         albuterol 108 (90 BASE) MCG/ACT inhaler  Commonly known as:  PROVENTIL HFA;VENTOLIN HFA  Inhale 2 puffs into the lungs every 6 (six) hours as needed for wheezing or shortness of breath.     aspirin 81 MG tablet  Take 81 mg by mouth daily.     calcium carbonate 600 MG Tabs tablet  Commonly known as:  OS-CAL  Take 600 mg by mouth daily.     clonazePAM 0.5 MG tablet  Commonly known as:  KLONOPIN  Take 0.5 mg by mouth daily as needed for anxiety.     clopidogrel 75 MG tablet  Commonly known as:  PLAVIX  Take 75 mg by mouth daily.     cyclobenzaprine 10 MG tablet  Commonly known as:  FLEXERIL  Take 10 mg by mouth 3 (three) times daily as needed for muscle spasms.     losartan 50 MG tablet  Commonly known as:  COZAAR  Take 50 mg by mouth daily as needed (for high blood pressure).     omeprazole 20 MG capsule  Commonly known as:  PRILOSEC  Take 20 mg by mouth daily.     traMADol 50 MG tablet  Commonly known as:  ULTRAM  Take 1 tablet (50 mg total) by mouth every 6 (six) hours as needed for moderate pain.        Prescriptions: Tramadol  #20 No Refill  Disposition: Home  Patient's condition: is Good.  Follow up: 1. Dr.  Oneida Alar in 2 weeks.   Virgina Jock, PA-C [facility] [phone]  --- For [facility] use --- Instructions: Press F2 to tab through selections.  Delete question if not applicable.   Modified Rankin score at D/C (0-6): 0  IV medication needed for:  1. Hypertension: No 2. Hypotension: No  Post-op Complications: No  1. Post-op CVA or TIA: No  2. CN injury: No  3. Myocardial infarction: No  4.  CHF: No  5.  Dysrhythmia (new): No  6. Wound infection: No  7. Reperfusion symptoms: No  8. Return to  OR: No  Discharge medications: Statin use:  No If No: [ ]  For Medical reasons, [ ]  Non-compliant, [x ] Not-indicated ASA use:  Yes  If No: [ ]  For Medical reasons, [ ]  Non-compliant, [ ]  Not-indicated Beta blocker use:  No If No: [ ]  For Medical reasons, [ ]  Non-compliant, [x ] Not-indicated, on losartan ACE-Inhibitor use:  No If No: [ ]  For Medical reasons, [ ]  Non-compliant, [ x] Not-indicated, on  losartan P2Y12 Antagonist use: Yes, [ x] Plavix, [ ]  Plasugrel, [ ]  Ticlopinine, [ ]  Ticagrelor, [ ]  Other, [ ]  No for medical reason, [ ]  Non-compliant, [ ]  Not-indicated Anti-coagulant use:  No, [ ]  Warfarin, [ ]  Rivaroxaban, [ ]  Dabigatran, [ ]  Other, [ ]  No for medical reason, [ ]  Non-compliant, [x ] Not-indicated

## 2014-02-28 ENCOUNTER — Encounter (HOSPITAL_COMMUNITY): Payer: Self-pay | Admitting: Vascular Surgery

## 2014-03-12 ENCOUNTER — Encounter: Payer: Self-pay | Admitting: Vascular Surgery

## 2014-03-13 ENCOUNTER — Encounter: Payer: Self-pay | Admitting: Vascular Surgery

## 2014-03-13 ENCOUNTER — Ambulatory Visit (INDEPENDENT_AMBULATORY_CARE_PROVIDER_SITE_OTHER): Payer: Medicare Other | Admitting: Vascular Surgery

## 2014-03-13 VITALS — BP 148/77 | HR 84 | Resp 16 | Ht 63.0 in | Wt 138.0 lb

## 2014-03-13 DIAGNOSIS — I6529 Occlusion and stenosis of unspecified carotid artery: Secondary | ICD-10-CM

## 2014-03-13 DIAGNOSIS — Z48812 Encounter for surgical aftercare following surgery on the circulatory system: Secondary | ICD-10-CM

## 2014-03-13 NOTE — Progress Notes (Signed)
S/p left CEA 02/25/14.  She has had uneventful recovery.  Swallow intact.  No weakness or numbness of face or extremities.  No incisional drainage.  She is on ASA.  Has known 50-70% right ICA stenosis  PE:  Filed Vitals:   03/13/14 1001 03/13/14 1005  BP: 137/84 148/77  Pulse: 88 84  Resp: 16   Height: 5\' 3"  (1.6 m)   Weight: 138 lb (62.596 kg)     Neck: healing left neck incision, still small amount of edema, no erythema or drainage Neuro: tongue midline no facial asymmetry, UE/LE 5/5 motor  A: doing well post left CEA P: follow up 6 months repeat duplex carotids.  Continue ASA, Control cholesterol BP  Ruta Hinds, MD [facility] [place] Office: [phone] Pager: [phone]

## 2014-03-14 ENCOUNTER — Encounter: Payer: Medicare Other | Admitting: Vascular Surgery

## 2014-08-13 ENCOUNTER — Emergency Department (HOSPITAL_COMMUNITY)
Admission: EM | Admit: 2014-08-13 | Discharge: 2014-08-13 | Disposition: A | Discharge status: Home / Self Care | Payer: Medicare Other | Attending: Emergency Medicine | Admitting: Emergency Medicine

## 2014-08-13 ENCOUNTER — Encounter (HOSPITAL_COMMUNITY): Payer: Self-pay | Admitting: Emergency Medicine

## 2014-08-13 ENCOUNTER — Emergency Department (HOSPITAL_COMMUNITY): Payer: Medicare Other

## 2014-08-13 DIAGNOSIS — Y9289 Other specified places as the place of occurrence of the external cause: Secondary | ICD-10-CM | POA: Diagnosis not present

## 2014-08-13 DIAGNOSIS — I10 Essential (primary) hypertension: Secondary | ICD-10-CM | POA: Diagnosis not present

## 2014-08-13 DIAGNOSIS — M545 Low back pain: Secondary | ICD-10-CM

## 2014-08-13 DIAGNOSIS — J441 Chronic obstructive pulmonary disease with (acute) exacerbation: Secondary | ICD-10-CM | POA: Diagnosis not present

## 2014-08-13 DIAGNOSIS — S3992XA Unspecified injury of lower back, initial encounter: Secondary | ICD-10-CM | POA: Diagnosis present

## 2014-08-13 DIAGNOSIS — S32010A Wedge compression fracture of first lumbar vertebra, initial encounter for closed fracture: Secondary | ICD-10-CM | POA: Diagnosis not present

## 2014-08-13 DIAGNOSIS — Z720 Tobacco use: Secondary | ICD-10-CM | POA: Insufficient documentation

## 2014-08-13 DIAGNOSIS — Y998 Other external cause status: Secondary | ICD-10-CM | POA: Insufficient documentation

## 2014-08-13 DIAGNOSIS — Z7902 Long term (current) use of antithrombotics/antiplatelets: Secondary | ICD-10-CM | POA: Diagnosis not present

## 2014-08-13 DIAGNOSIS — Z880 Allergy status to penicillin: Secondary | ICD-10-CM | POA: Diagnosis not present

## 2014-08-13 DIAGNOSIS — K219 Gastro-esophageal reflux disease without esophagitis: Secondary | ICD-10-CM | POA: Diagnosis not present

## 2014-08-13 DIAGNOSIS — Z8673 Personal history of transient ischemic attack (TIA), and cerebral infarction without residual deficits: Secondary | ICD-10-CM | POA: Insufficient documentation

## 2014-08-13 DIAGNOSIS — Z7982 Long term (current) use of aspirin: Secondary | ICD-10-CM | POA: Diagnosis not present

## 2014-08-13 DIAGNOSIS — M8580 Other specified disorders of bone density and structure, unspecified site: Secondary | ICD-10-CM | POA: Insufficient documentation

## 2014-08-13 DIAGNOSIS — Z872 Personal history of diseases of the skin and subcutaneous tissue: Secondary | ICD-10-CM | POA: Diagnosis not present

## 2014-08-13 DIAGNOSIS — Y9389 Activity, other specified: Secondary | ICD-10-CM | POA: Insufficient documentation

## 2014-08-13 DIAGNOSIS — Z79899 Other long term (current) drug therapy: Secondary | ICD-10-CM | POA: Diagnosis not present

## 2014-08-13 DIAGNOSIS — X58XXXA Exposure to other specified factors, initial encounter: Secondary | ICD-10-CM | POA: Diagnosis not present

## 2014-08-13 DIAGNOSIS — Z8639 Personal history of other endocrine, nutritional and metabolic disease: Secondary | ICD-10-CM | POA: Insufficient documentation

## 2014-08-13 MED ORDER — HYDROCODONE-ACETAMINOPHEN 5-325 MG PO TABS
1.0000 | ORAL_TABLET | Freq: Once | ORAL | Status: AC
  Administered 2014-08-13: 1 via ORAL
  Filled 2014-08-13: qty 1

## 2014-08-13 MED ORDER — METHOCARBAMOL 500 MG PO TABS: 1000.0000 mg | ORAL_TABLET | Freq: Four times a day (QID) | ORAL | Status: AC | PRN

## 2014-08-13 MED ORDER — HYDROCODONE-ACETAMINOPHEN 5-325 MG PO TABS
ORAL_TABLET | ORAL | Status: AC
Start: 2014-08-13 — End: ?

## 2014-08-13 NOTE — ED Notes (Signed)
Pt c/o lower back pain since helping pull husband up out off floor on Saturday evening.States she felt and "pop" Denies gi/gu. Denies numbness/tingling. nad noted.

## 2014-08-13 NOTE — ED Provider Notes (Signed)
CSN: [id number]     Arrival date & time 08/13/14  0910 History   First MD Initiated Contact with Patient 08/13/14 [phone]     Chief Complaint  Patient presents with  . Back Pain     HPI Pt was seen at 1000. Per pt, c/o gradual onset and persistence of constant low back "pain" for the past 3 days. States the pain began after she helped pull her husband up off the floor on Saturday. States she "felt a pop" in her low back. Pain worsens with palpation of the area and body position changes. Pt has been ambulatory since the incident. Denies incont/retention of bowel or bladder, no saddle anesthesia, no focal motor weakness, no tingling/numbness in extremities, no fevers, no direct injury, no abd pain. The symptoms have been associated with no other complaints.    Past Medical History  Diagnosis Date  . Carotid artery occlusion   . Stroke   . Hyperlipidemia   . Hypertension   . COPD (chronic obstructive pulmonary disease)   . Gastroesophageal reflux disease   . Osteopenia   . Eczematous dermatitis   . Shortness of breath   . KGMWNUUV(253.6)    Past Surgical History  Procedure Laterality Date  . Abdominal hysterectomy    . Cervical fusion    . Breast surgery      biopsy  . Cataract extraction w/ intraocular lens  implant, bilateral    . Dilation and curettage of uterus    . Endarterectomy Left 02/25/2014    Procedure: LEFT CAROTID ARTERY  ENDARTERECTOMY WITH DACRON PATCH ANGIOPLASTY;  Surgeon: Elam Dutch, MD;  Location: [facility] OR;  Service: Vascular;  Laterality: Left;   Family History  Problem Relation Age of Onset  . Cancer Other   . Diabetes Other    History  Substance Use Topics  . Smoking status: Current Some Day Smoker -- 0.50 packs/day    Types: Cigarettes  . Smokeless tobacco: Never Used  . Alcohol Use: No    Review of Systems ROS: Statement: All systems negative except as marked or noted in the HPI; Constitutional: Negative for fever and chills. ; ; Eyes: Negative  for eye pain, redness and discharge. ; ; ENMT: Negative for ear pain, hoarseness, nasal congestion, sinus pressure and sore throat. ; ; Cardiovascular: Negative for chest pain, palpitations, diaphoresis, dyspnea and peripheral edema. ; ; Respiratory: Negative for cough, wheezing and stridor. ; ; Gastrointestinal: Negative for nausea, vomiting, diarrhea, abdominal pain, blood in stool, hematemesis, jaundice and rectal bleeding. . ; ; Genitourinary: Negative for dysuria, flank pain and hematuria. ; ; Musculoskeletal: +LBP. Negative for neck pain. Negative for swelling and trauma.; ; Skin: Negative for pruritus, rash, abrasions, blisters, bruising and skin lesion.; ; Neuro: Negative for headache, lightheadedness and neck stiffness. Negative for weakness, altered level of consciousness , altered mental status, extremity weakness, paresthesias, involuntary movement, seizure and syncope.      Allergies  Codeine; Penicillins; Propranolol hcl; and Zithromax  Home Medications   Prior to Admission medications   Medication Sig Start Date End Date Taking? Authorizing Provider  aspirin EC 81 MG tablet Take 81 mg by mouth daily.   Yes Historical Provider, MD  calcium carbonate (OS-CAL) 600 MG TABS tablet Take 600 mg by mouth daily.    Yes Historical Provider, MD  clopidogrel (PLAVIX) 75 MG tablet Take 75 mg by mouth daily.   Yes Historical Provider, MD  cyclobenzaprine (FLEXERIL) 10 MG tablet Take 10 mg by mouth 3 (three)  times daily as needed for muscle spasms.    Yes Historical Provider, MD  ibuprofen (ADVIL,MOTRIN) 200 MG tablet Take 200 mg by mouth every 6 (six) hours as needed (pain).   Yes Historical Provider, MD  losartan (COZAAR) 50 MG tablet Take 50 mg by mouth daily.    Yes Historical Provider, MD  omeprazole (PRILOSEC OTC) 20 MG tablet Take 20 mg by mouth daily.   Yes Historical Provider, MD  albuterol (PROVENTIL HFA;VENTOLIN HFA) 108 (90 BASE) MCG/ACT inhaler Inhale 2 puffs into the lungs every 6  (six) hours as needed for wheezing or shortness of breath.     Historical Provider, MD  clonazePAM (KLONOPIN) 0.5 MG tablet Take 0.5 mg by mouth daily as needed for anxiety.     Historical Provider, MD  traMADol (ULTRAM) 50 MG tablet Take 1 tablet (50 mg total) by mouth every 6 (six) hours as needed for moderate pain. Patient not taking: Reported on 08/13/2014 02/26/14   Joelene Millin A Trinh, PA-C   BP 145/72 mmHg  Pulse 91  Temp(Src) 97.9 F (36.6 C)  Resp 18  Ht 5\' 2"  (1.575 m)  Wt 138 lb (62.596 kg)  BMI 25.23 kg/m2  SpO2 92% Physical Exam  1005: Physical examination:  Nursing notes reviewed; Vital signs and O2 SAT reviewed;  Constitutional: Well developed, Well nourished, Well hydrated, In no acute distress; Head:  Normocephalic, atraumatic; Eyes: EOMI, PERRL, No scleral icterus; ENMT: Mouth and pharynx normal, Mucous membranes moist; Neck: Supple, Full range of motion, No lymphadenopathy; Cardiovascular: Regular rate and rhythm, No gallop; Respiratory: Breath sounds clear & equal bilaterally, No rales, rhonchi, wheezes.  Speaking full sentences with ease, Normal respiratory effort/excursion; Chest: Nontender, Movement normal; Abdomen: Soft, Nontender, Nondistended, Normal bowel sounds; Genitourinary: No CVA tenderness; Spine:  No midline CS, TS, lower LS tenderness. +TTP upper midline LS and bilat lumbar paraspinal muscles. No rash, no ecchymosis.;; Extremities: Pulses normal, No tenderness, No edema, No calf edema or asymmetry.; Neuro: AA&Ox3, Major CN grossly intact.  Speech clear. No gross focal motor or sensory deficits in extremities. Strength 5/5 equal bilat UE's and LE's, including great toe dorsiflexion.  DTR 2/4 equal bilat UE's and LE's.  No gross sensory deficits.  Neg straight leg raises bilat.; Skin: Color normal, Warm, Dry.   ED Course  Procedures     EKG Interpretation None      MDM  MDM Reviewed: previous chart, nursing note and vitals Interpretation: x-ray     Dg  Lumbar Spine Complete 08/13/2014   CLINICAL DATA:  Acute lower back pain after helping husband off the floor. No radiation to the legs is noted.  EXAM: LUMBAR SPINE - COMPLETE 4+ VIEW  COMPARISON:  February 18, 2014.  FINDINGS: No spondylolisthesis is noted. Mild compression deformity is seen involving superior endplate of L1 vertebral body concerning for acute fracture. Atherosclerotic calcifications of abdominal aorta are noted. Disc spaces appear well maintained. Degenerative changes are seen involving posterior facet joints of L5-S1.  IMPRESSION: Mild compression deformity of L1 vertebral body is noted consistent with acute fracture.   Electronically Signed   By: Sabino Dick M.D.   On: 08/13/2014 10:12    1100:  Pt has gotten herself dressed and wants to go home now. No change in neuro assessment. Requesting rx pain meds and "a different muscle relaxer." Pt received norco in the ED without apparent s/e; will rx. Dx and testing d/w pt and family.  Questions answered.  Verb understanding, agreeable to d/c home with  outpt f/u.   Francine Graven, DO 08/17/14 Layla Maw

## 2014-08-13 NOTE — Discharge Instructions (Signed)
°Emergency Department Resource Guide °1) Find a Doctor and Pay Out of Pocket °Although you won't have to find out who is covered by your insurance plan, it is a good idea to ask around and get recommendations. You will then need to call the office and see if the doctor you have chosen will accept you as a new patient and what types of options they offer for patients who are self-pay. Some doctors offer discounts or will set up payment plans for their patients who do not have insurance, but you will need to ask so you aren't surprised when you get to your appointment. ° °2) Contact Your Local Health Department °Not all health departments have doctors that can see patients for sick visits, but many do, so it is worth a call to see if yours does. If you don't know where your local health department is, you can check in your phone book. The CDC also has a tool to help you locate your state's health department, and many state websites also have listings of all of their local health departments. ° °3) Find a Walk-in Clinic °If your illness is not likely to be very severe or complicated, you may want to try a walk in clinic. These are popping up all over the country in pharmacies, drugstores, and shopping centers. They're usually staffed by nurse practitioners or physician assistants that have been trained to treat common illnesses and complaints. They're usually fairly quick and inexpensive. However, if you have serious medical issues or chronic medical problems, these are probably not your best option. ° °No Primary Care Doctor: °- Call [facility] at  832-8000 - they can help you locate a primary care doctor that  accepts your insurance, provides certain services, etc. °- Physician Referral Service- 1-800-533-3463 ° °Chronic Pain Problems: °Organization         Address  Phone   Notes  ° [facility]  (336) 297-2271 Patients need to be referred by their primary care doctor.  ° °Medication  Assistance: °Organization         Address  Phone   Notes  °[place] [facility] [address] °[place], [place] [address] (336) 641-8030 --Must be a resident of [place] °-- Must have NO insurance coverage whatsoever (no Medicaid/ Medicare, etc.) °-- The pt. MUST have a primary care doctor that directs their care regularly and follows them in the community °  °[facility]  (866) 331-1348   °[employer]  (888) 892-1162   ° °Agencies that provide inexpensive medical care: °Organization         Address  Phone   Notes  °[facility]  (336) 832-8035   °[facility]    (336) 832-7272   °[facility] [address] °[place], [place] [address] (336) 832-4777   °[facility] [address], °[place] (336) 271-4999   °[facility]    (336) 373-0678   °[facility]    (336) 272-1050   °[facility] ° [address], [place] Phone:  (336) 832-4444, Fax:  (336) 832-4440 Hours of Operation:  9 am - 6 pm, M-F.  Also accepts Medicaid/Medicare and self-pay.  °[facility] ° [address], [place] Phone: (336) 832-3150, Fax: (336) 832-3151. Hours of Operation:  8:30 am - 5:30 pm, M-F.  Also accepts Medicaid and self-pay.  °[facility] [place] [address], [place] Phone: (336) 878-6027   °[facility] [address], [place], [place] (336)723-1848, Ext. 123 Mondays & Thursdays: 7-9 AM.  First 15 patients are seen on a first come, first serve basis. °  ° °Medicaid-accepting [place] Providers: ° °Organization         Address  Phone   Notes  °[facility] [address], [place] (336) 641-2100 Also accepts self-pay patients.  °[facility] [address], [place] ° (336) 856-9996   °[facility] [address], [place]  (336) 288-8857   °[facility] [address] [place] [address], [place] (336) 299-7000   °Veita Bland [address], [place]  ° (336) 373-1557 Only accepts [place] Medicaid patients after they have their name applied to their card.  ° °Self-Pay (no insurance) in [place]: ° °Organization         Address  Phone   Notes  °Sickle Cell Patients, [facility] [address], [place] (336) 832-1970   °[facility] [address], [place] (336) 832-4400   °[facility] ° [address],  (336) 992-4800   °[facility]/Dr. Osei-Bonsu ° [address] [place] [address], [place] or [address], [place] (336) 841-8500 Phone number for both [place] and [place] locations is the same.  °[facility] [address], [place] (336) 299-0000   °[facility] [address] [place] [address], [place] or [address] (336) 852-7530 °(336) 878-2260   °[facility] [address], [place] (336) 350-1642, phone; (336) 294-5005, fax Sees patients 1st and 3rd Saturday of every month.  Must not qualify for public or private insurance (i.e. Medicaid, Medicare, [place] Health Choice, Veterans' Benefits) • Household income should be no more than 200% of the poverty level •The clinic cannot treat you if you are pregnant or think you are pregnant • Sexually transmitted diseases are not treated at the clinic.  ° ° °Dental Care: °Organization         Address  Phone  Notes  °[place] [facility] [address], [place] (336) 641-6152 Accepts children up to age 21 who are enrolled in Medicaid or [place] Health Choice; pregnant women with a Medicaid card; and children who have applied for Medicaid or [place] Health Choice, but were declined, whose parents can pay a reduced fee at time of service.  °[place]  [facility] [place]  [address], [place] (336) 641-7733 Accepts children up to age 21 who are enrolled in Medicaid or [place] Health Choice; pregnant women with a Medicaid card; and children who have applied for Medicaid or [place] Health Choice, but were declined, whose parents can pay a reduced fee at time of service.  °[facility] ° [address], [place] (336) 641-4533 Patients are seen by appointment only. Walk-ins are not accepted. [facility] will see patients 18 years of age and older. °Monday - Tuesday (8am-5pm) °Most Wednesdays (8:30-5pm) °$30 per visit, cash only  °[facility] ° [address], [place] (336) 641-4533 Patients are seen by appointment only. Walk-ins are not accepted. [facility] will see patients 18 years of age and older. °One   Wednesday Evening (Monthly: Volunteer Based).  $30 per visit, cash only  °[facility]  (919) 537-3737 for adults; Children under age 4, call Graduate Pediatric Dentistry at ([phone]) [phone]. Children aged 4-14, please call ([phone]) [phone] to request a pediatric application. ° Dental services are provided in all areas of dental care including fillings, crowns and bridges, complete and partial dentures, implants, gum treatment, root canals, and extractions. Preventive care is also provided. Treatment is provided to both adults and children. °Patients are selected via a lottery and there is often a waiting list. °  °[facility] [address], °[place] ° (336) 763-8833 [email or web address] °  °[facility] [address], [place], [place] (336)723-1848, Ext. 123 Second and Fourth Thursday of each month, opens at 6:30 AM; Clinic ends at 9 AM.  Patients are seen on a first-come first-served basis, and a limited number are seen during each clinic.  ° °[facility] ° [address], [place], [place] (336) 723-7904    Eligibility Requirements °You must have lived in [place], [place], or [place] counties for at least the last three months. °  You cannot be eligible for state or federal sponsored healthcare insurance, including Veterans Administration, Medicaid, or Medicare. °  You generally cannot be eligible for healthcare insurance through your employer.  °  How to apply: °Eligibility screenings are held every Tuesday and Wednesday afternoon from 1:00 pm until 4:00 pm. You do not need an appointment for the interview!  °[facility] [address], [place], [place] 336-631-2330   °[facility]  336-342-8273   °[facility]  336-703-3100   °[facility]  336-570-6415   ° °Behavioral Health Resources in the Community: °Intensive Outpatient Programs °Organization         Address  Phone  Notes  °[facility] [address], [place], [place] 336-878-6098   °[facility] [address], [place], [place] 336-832-9800   °[facility] [address], [place], [place] ° 336-882-2125   °[facility] [address],  °[place], [place] 1-800-853-5163 or 336-641-4981   °Substance Abuse Resources °Organization         Address  Phone  Notes  °[facility]  336-882-2125   °[facility]  336-784-9470   °[facility]  336-285-9073   °[facility]  336-845-3988   °Residential & Outpatient Substance Abuse Program  1-800-659-3381   °Psychological Services °Organization         Address  Phone  Notes  °[facility]  336- 832-9600   °[facility]  336- 378-7881   °[facility] [address], [place] 1-800-853-5163 or 336-641-4981   ° °Mobile Crisis Teams °Organization         Address  Phone  Notes  °[facility], Mobile Crisis Care Unit  1-877-626-1772   °Assertive °[facility] ° [address]  [place], [place] 336-834-9664   °Sharon DeEsch [address] °[place] 336-554-5454   ° °Self-Help/Support Groups °Organization         Address  Phone             Notes  °[facility] - variety of support groups  336- 373-1402 Call for more information  °Narcotics Anonymous (NA), [facility] [address], °[place]  2 meetings at this location  ° °  Residential Treatment Programs °Organization         Address  Phone  Notes  °[facility] [address],    °[place]  1-866-801-8205   °[facility] ° [address], [place], [place] 704-293-8524   °[facility] [address], [place] 336-845-3988 Admissions: 8am-3pm M-F  °[facility] [address]    °[place], [place] 336-841-1104   °[facility] [address], [place], [place] 336-379-7146   °[facility] [address]  °[place], [place] 336-285-9073   °[facility] - Intensive Outpatient [address], [place], [place] 336-852-3033   °[facility] [address]  °[place], [place] 1-877-615-2722 or 336-784-9470   °[facility] [address] [place], [place] 336-227-7417 Accepts Medicaid  °[facility] [address]  °[place] 1-800-659-3381 Substance Abuse/Addiction Treatment  ° °[place] Behavioral Health Resources °Organization         Address  Phone  Notes  °[facility]  (888) 581-9988   °Julie Brannon, PhD [address] [place], [place]   (336) 349-5553 or (336) 951-0000   °[facility]   [address] °[place], [place] (336) 349-4454   °[facility] [address], [place], [place] (336) 342-8316 Insurance/Medicaid/sponsorship through [facility]  °[facility] [address]                                    [place], [place] (336) 342-8316 Therapy/tele-psych/case    °[facility] [address]  ° [place], [place] (336) 349-2233    °Dr. Arfeen  (336) 349-4544   °[facility] [place]  [employer] [place] [facility] 1) [address], [place] °2) [address], [place] °3)  [address], [place] (336) 349-3220 °(336) 342-7768 ° °(336) 342-8140   °[place] [facility] (336) 342-1394 or (336) 342-3537 (After Hours)    ° ° ° °Take the prescriptions as directed.  Apply moist heat or ice to the area(s) of discomfort, for 15 minutes at a time, several times per day for the next few days.  Do not fall asleep on a heating or ice pack.  Call your regular medical doctor today to schedule a follow up appointment this week.  Return to the Emergency Department immediately if worsening. ° °

## 2014-09-11 ENCOUNTER — Ambulatory Visit: Payer: Medicare Other | Admitting: Family

## 2014-09-11 ENCOUNTER — Other Ambulatory Visit (HOSPITAL_COMMUNITY): Payer: Medicare Other

## 2015-05-08 IMAGING — CR DG CHEST 2V
2 series · 2 of 2 positions shown · non-contrast
Comparison: 12/28/2007

CLINICAL DATA: Preop for carotid endarterectomy

EXAM:
CHEST  2 VIEW

[w chest pa]
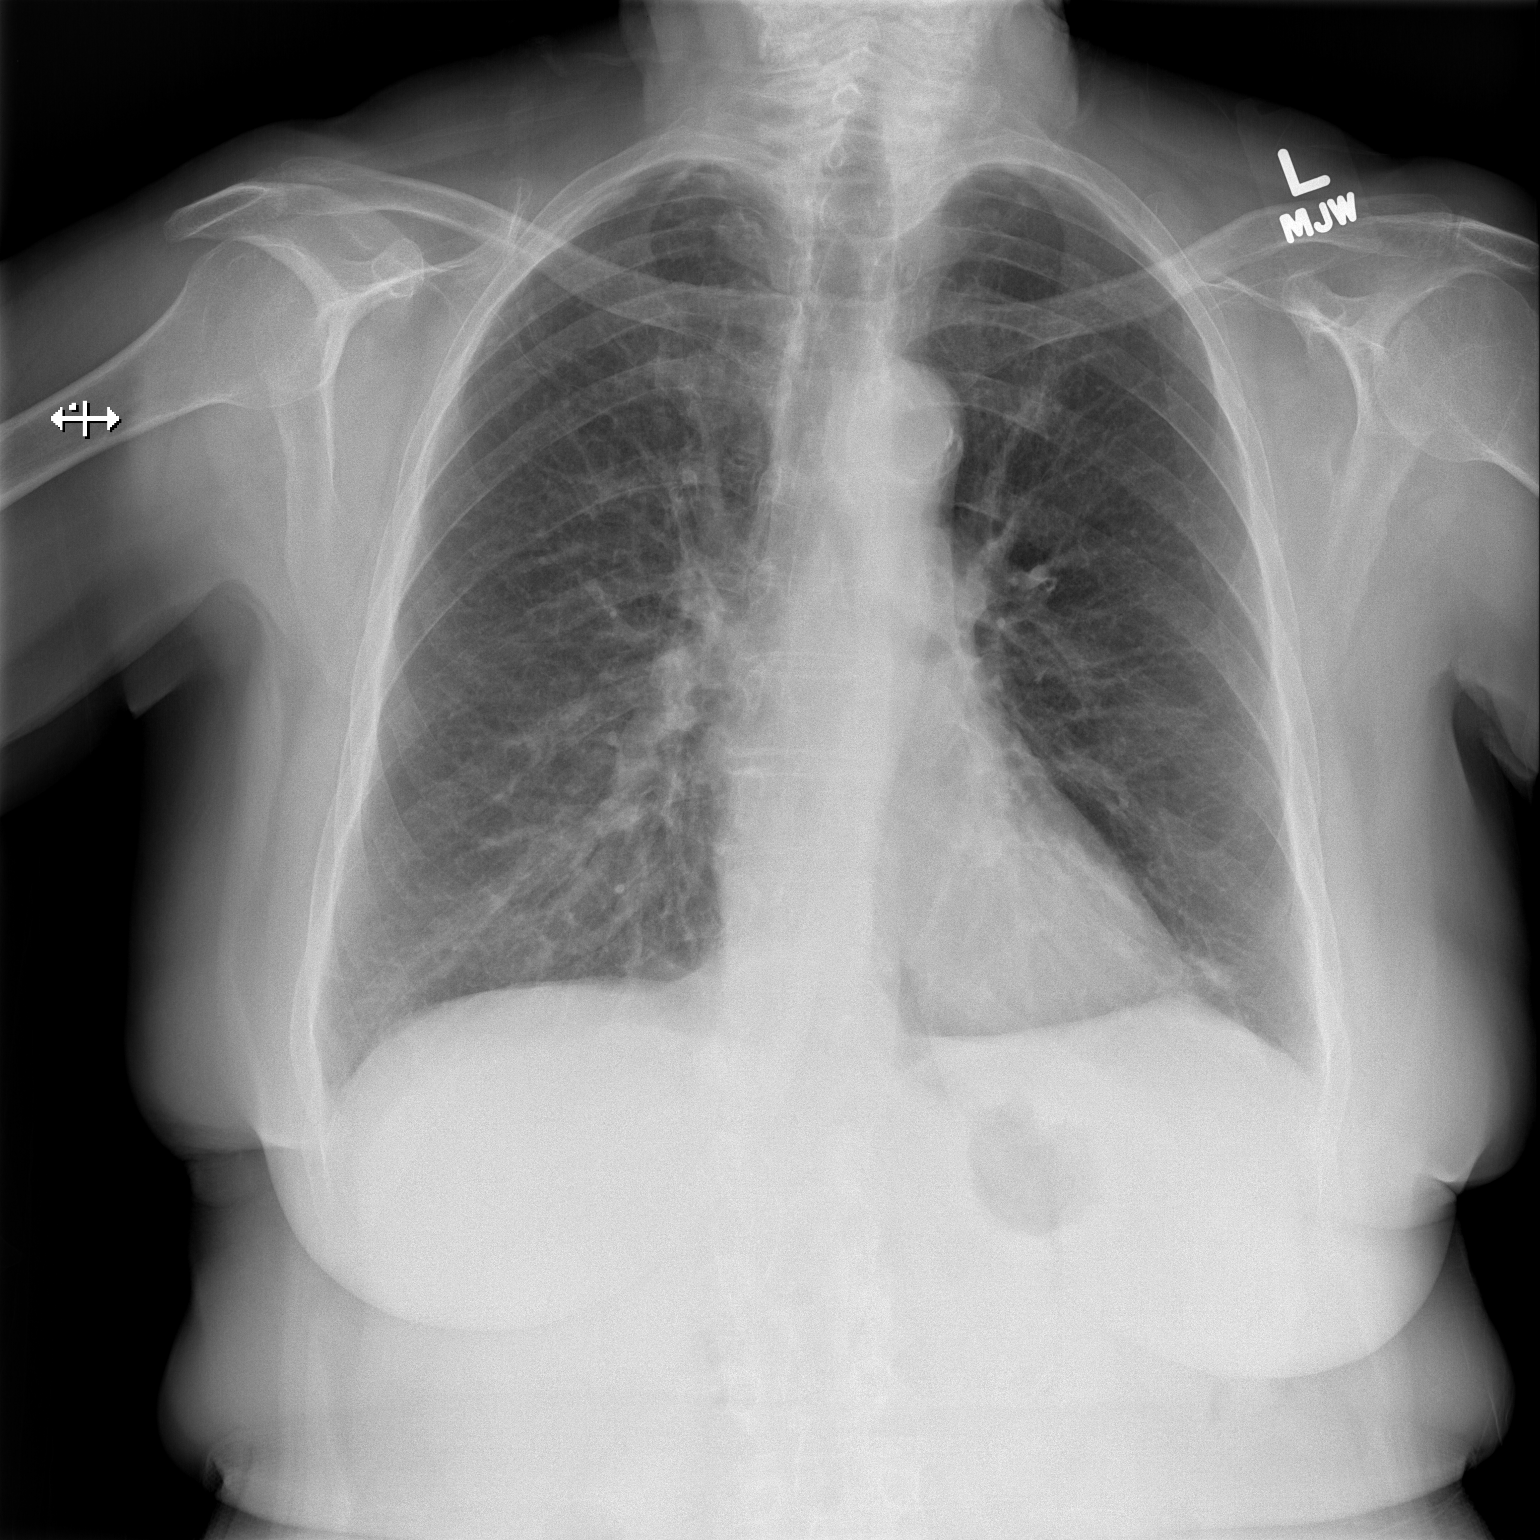

[w chest lat]
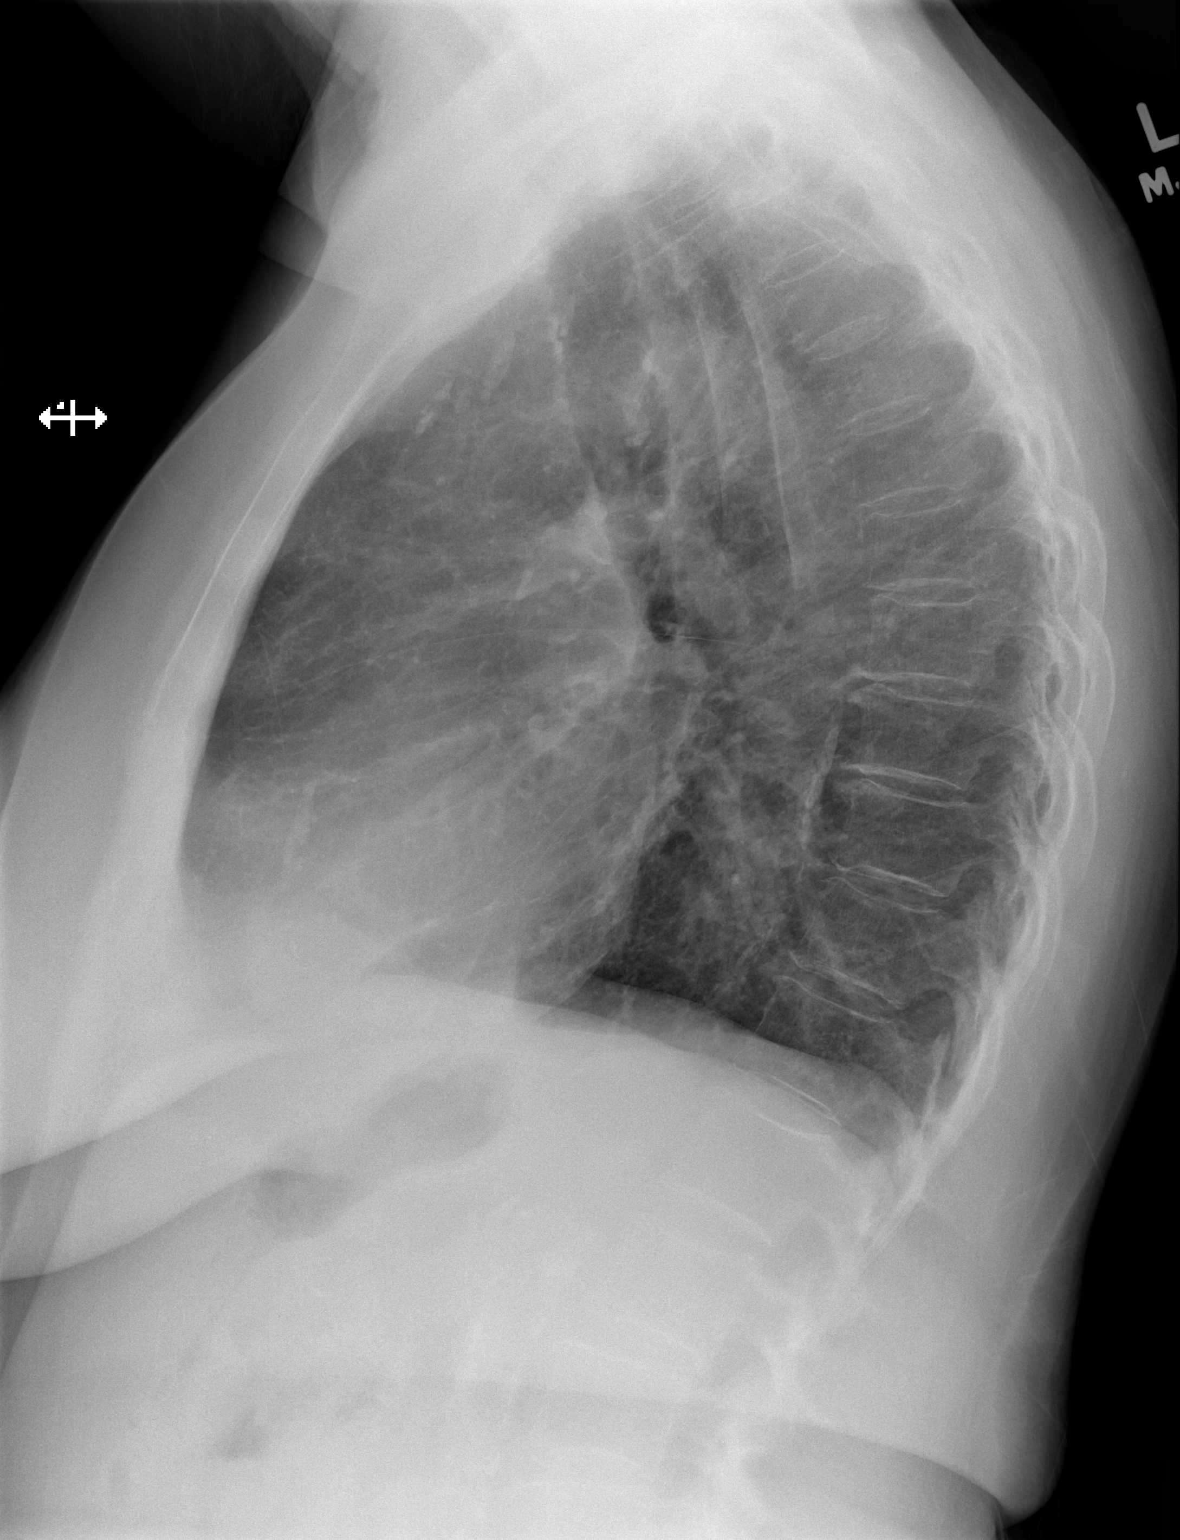

[2 of 2 positions shown; findings below may reference images not displayed]

FINDINGS: Cardiomediastinal silhouette is stable. No acute infiltrate or
pleural effusion. No pulmonary edema. Chronic mild interstitial
prominence and mild bronchitic changes. Mild degenerative changes
mid thoracic spine.
IMPRESSION: No active disease. Chronic mild interstitial prominence and central
mild bronchitic changes.

## 2015-12-17 ENCOUNTER — Encounter: Payer: Self-pay | Admitting: Cardiology

## 2017-12-20 ENCOUNTER — Encounter (INDEPENDENT_AMBULATORY_CARE_PROVIDER_SITE_OTHER): Payer: Self-pay | Admitting: Ophthalmology

## 2017-12-20 ENCOUNTER — Ambulatory Visit (INDEPENDENT_AMBULATORY_CARE_PROVIDER_SITE_OTHER): Payer: Medicare Other | Admitting: Ophthalmology

## 2017-12-20 DIAGNOSIS — H4301 Vitreous prolapse, right eye: Secondary | ICD-10-CM

## 2017-12-20 DIAGNOSIS — H43813 Vitreous degeneration, bilateral: Secondary | ICD-10-CM

## 2017-12-20 DIAGNOSIS — H35373 Puckering of macula, bilateral: Secondary | ICD-10-CM | POA: Diagnosis not present

## 2017-12-20 DIAGNOSIS — Z961 Presence of intraocular lens: Secondary | ICD-10-CM

## 2017-12-20 DIAGNOSIS — H3581 Retinal edema: Secondary | ICD-10-CM

## 2017-12-20 NOTE — Progress Notes (Signed)
[facility] Note  12/20/2017     CHIEF COMPLAINT Patient presents for Retina Evaluation   HISTORY OF PRESENT ILLNESS: Virginia Fleming is a 84 y.o. female who presents to the clinic today for:   HPI    Retina Evaluation    In right eye.  Duration of 3 days.  Context:  distance vision, mid-range vision and near vision.  Treatments tried include no treatments.  I, the attending physician,  performed the HPI with the patient and updated documentation appropriately.          Comments    Pt presents on the referral of Dr. Marin Comment for decreased vision OD, possible CME OD, pt states about a month ago she started noticing her vision getting worse, pt denies flashes, floaters, pain or waving vision, but states it waters all the time and both eyes often turn pink like she has pink eye, pt is using Restasis OU, pt denies being diabetic,        Last edited by Bernarda Caffey, MD on 12/20/2017 10:51 AM. (History)    Pt states Saturday she was at [facility] on Saturday and felt she had "the pink eyes" so she stopped in to see Dr. Marin Comment; Pt states she has been using restasis prescribed by Dr. Marin Comment; Pt reports she has been having trouble seeing at distance, pt states she has not gotten new specs recently; Pt states she was told by Dr. Marin Comment that she has "swelling in the back on my eyes";   Referring physician: Harlen Labs, MD [phone] [facility] [address] [place], [place] [address]  HISTORICAL INFORMATION:   Selected notes from the MEDICAL RECORD NUMBER Referred by Dr. Anthony Sar for concern of decreased vision LEE: Milagros Reap) Ocular Hx-Pseudophakia OU (Dr. Iona Hansen '89, '77) PMH-COPD, HTN, Stroke    CURRENT MEDICATIONS: Current Outpatient Medications (Ophthalmic Drugs)  Medication Sig  . RESTASIS 0.05 % ophthalmic emulsion instill ONE drop IN [facility] EYE TWICE DAILY   No current facility-administered medications for this visit.  (Ophthalmic Drugs)   Current Outpatient Medications (Other)  Medication Sig   . albuterol (PROVENTIL HFA;VENTOLIN HFA) 108 (90 BASE) MCG/ACT inhaler Inhale 2 puffs into the lungs every 6 (six) hours as needed for wheezing or shortness of breath.   Marland Kitchen amLODipine (NORVASC) 5 MG tablet Take 5 mg by mouth daily.  Marland Kitchen aspirin EC 81 MG tablet Take 81 mg by mouth daily.  . calcium carbonate (OS-CAL) 600 MG TABS tablet Take 600 mg by mouth daily.   . clonazePAM (KLONOPIN) 0.5 MG tablet Take 0.5 mg by mouth daily as needed for anxiety.   . clopidogrel (PLAVIX) 75 MG tablet Take 75 mg by mouth daily.  Marland Kitchen escitalopram (LEXAPRO) 20 MG tablet Take 20 mg by mouth daily.  Marland Kitchen HYDROcodone-acetaminophen (NORCO/VICODIN) 5-325 MG per tablet 1 or 2 tabs PO q8 hours prn pain  . ibuprofen (ADVIL,MOTRIN) 200 MG tablet Take 200 mg by mouth every 6 (six) hours as needed (pain).  Marland Kitchen losartan (COZAAR) 50 MG tablet Take 50 mg by mouth daily.   . meloxicam (MOBIC) 15 MG tablet Take 15 mg by mouth every evening.  . methocarbamol (ROBAXIN) 500 MG tablet Take 2 tablets (1,000 mg total) by mouth 4 (four) times daily as needed for muscle spasms (muscle spasm/pain).  Marland Kitchen omeprazole (PRILOSEC OTC) 20 MG tablet Take 20 mg by mouth daily.  Marland Kitchen tiZANidine (ZANAFLEX) 4 MG tablet Take 4 mg by mouth daily as needed.  No current facility-administered medications for this visit.  (Other)      REVIEW OF SYSTEMS: ROS    Positive for: Cardiovascular, Eyes   Negative for: Constitutional, Gastrointestinal, Neurological, Skin, Genitourinary, Musculoskeletal, HENT, Endocrine, Respiratory, Psychiatric, Allergic/Imm, Heme/Lymph   Last edited by Debbrah Alar, COT on 12/20/2017  9:16 AM. (History)       ALLERGIES Allergies  Allergen Reactions  . Codeine Hives  . Penicillins Hives  . Propranolol Hcl Other (See Comments)    Unknown  . Zithromax [Azithromycin] Other (See Comments)    Unknown    PAST MEDICAL HISTORY Past Medical History:  Diagnosis Date  . Carotid artery occlusion   . COPD (chronic obstructive  pulmonary disease) ([place])   . Eczematous dermatitis   . Gastroesophageal reflux disease   . Headache(784.0)   . Hyperlipidemia   . Hypertension   . Osteopenia   . Shortness of breath   . Stroke [facility])    Past Surgical History:  Procedure Laterality Date  . ABDOMINAL HYSTERECTOMY    . BREAST SURGERY     biopsy  . CATARACT EXTRACTION    . CATARACT EXTRACTION W/ INTRAOCULAR LENS  IMPLANT, BILATERAL    . CERVICAL FUSION    . DILATION AND CURETTAGE OF UTERUS    . ENDARTERECTOMY Left 02/25/2014   Procedure: LEFT CAROTID ARTERY  ENDARTERECTOMY WITH DACRON PATCH ANGIOPLASTY;  Surgeon: Elam Dutch, MD;  Location: [facility] OR;  Service: Vascular;  Laterality: Left;    FAMILY HISTORY Family History  Problem Relation Age of Onset  . Cataracts Father   . Cancer Other   . Diabetes Other   . Amblyopia Neg Hx   . Blindness Neg Hx   . Glaucoma Neg Hx   . Macular degeneration Neg Hx   . Retinal detachment Neg Hx   . Strabismus Neg Hx   . Thyroid disease Neg Hx   . Retinitis pigmentosa Neg Hx     SOCIAL HISTORY Social History   Tobacco Use  . Smoking status: Current Every Day Smoker    Packs/day: 0.50    Types: Cigarettes  . Smokeless tobacco: Never Used  Substance Use Topics  . Alcohol use: No  . Drug use: No         OPHTHALMIC EXAM:  Base Eye Exam    Visual Acuity (Snellen - Linear)      Right Left   Dist cc 20/40 -1 20/40 -2   Dist ph cc NI 20/30   Correction:  Glasses       Tonometry (Tonopen, 9:32 AM)      Right Left   Pressure 15 14       Pupils      Dark Light Shape React APD   Right 6 4 Round Brisk None   Left 6 4 Round Brisk None       Visual Fields (Counting fingers)      Left Right    Full Full       Extraocular Movement      Right Left    Full, Ortho Full, Ortho       Neuro/Psych    Oriented x3:  Yes   Mood/Affect:  Normal       Dilation    Both eyes:  1.0% Mydriacyl, 2.5% Phenylephrine @ 9:32 AM        Slit Lamp and Fundus Exam     Slit Lamp Exam      Right Left   Lids/Lashes Dermatochalasis -  upper lid, papillomas along lash line, Meibomian gland dysfunction Dermatochalasis - upper lid, Meibomian gland dysfunction   Conjunctiva/Sclera White and quiet White and quiet   Cornea Arcus, otherwise clear Arcus, mild endo pigment   Anterior Chamber Deep and quiet, no cell or flare Deep and quiet   Iris Round and well dilated Round and well dilated   Lens PC IOL in good position, open PC with vitreous prolapse superiorly Three piece PC IOL in good position   Vitreous Vitreous syneresis, Posterior vitreous detachment Vitreous syneresis, Posterior vitreous detachment       Fundus Exam      Right Left   Disc Pink and Sharp Pink and Sharp   C/D Ratio 0.3 0.2   Macula Blunted foveal reflex, Epiretinal membrane, mild Retinal pigment epithelial mottling, rare Drusen, No heme or edema Good foveal reflex, Epiretinal membrane, Retinal pigment epithelial mottling, No heme or edema   Vessels Mildly Tortuous, mild Vascular attenuation, AV crossing changes Mildly Tortuous, mild Vascular attenuation   Periphery Attached Attached        Refraction    Wearing Rx      Sphere Cylinder Axis Add   Right -2.25 +2.25 025 +2.50   Left -2.00 +1.25 180 +2.50       Manifest Refraction      Sphere Cylinder Axis Dist VA   Right -2.25 +2.25 025 20/40   Left -2.25 +1.25 180 20/30-1          IMAGING AND PROCEDURES  Imaging and Procedures for @TODAY @  OCT, Retina - OU - Both Eyes       Right Eye Quality was good. Central Foveal Thickness: 253. Progression has no prior data. Findings include normal foveal contour, no IRF, no SRF (Trace ERM).   Left Eye Quality was good. Central Foveal Thickness: 270. Progression has no prior data. Findings include normal foveal contour, no IRF, no SRF, retinal drusen , epiretinal membrane.   Notes *Images captured and stored on drive  Diagnosis / Impression:  OD: NFP, No IRF/SRF, trace ERM OS:  NFP, No IRF/SRF, mild ERM  Clinical management:  See below  Abbreviations: NFP - Normal foveal profile. CME - cystoid macular edema. PED - pigment epithelial detachment. IRF - intraretinal fluid. SRF - subretinal fluid. EZ - ellipsoid zone. ERM - epiretinal membrane. ORA - outer retinal atrophy. ORT - outer retinal tubulation. SRHM - subretinal hyper-reflective material                  ASSESSMENT/PLAN:    ICD-10-CM   1. Epiretinal membrane (ERM) of both eyes H35.373   2. Vitreous prolapse of right eye H43.01   3. Posterior vitreous detachment of both eyes H43.813   4. Retinal edema H35.81 OCT, Retina - OU - Both Eyes  5. Pseudophakia of both eyes Z96.1     1. Epiretinal membrane, OU  The natural history, anatomy, potential for loss of vision, and treatment options including vitrectomy techniques and the complications of endophthalmitis, retinal detachment, vitreous hemorrhage, cataract progression and permanent vision loss discussed with the patient. - very mild ERM OU - no indication for surgery at this time - F/U 4-6 months   2. Vitreous Prolapse OD-  - mild vitreous prolapse along superior margin of IOL - no traction or vitreous strands pulling anteriorly - monitor  3. PVD / vitreous syneresis OU  Discussed findings and prognosis  No RT or RD on 360 scleral depressed exam  Reviewed s/s of RT/RD  Strict return  precautions for any such RT/RD signs/symptoms  4. No retinal edema on exam or OCT  5. Pseudophakia OU  - s/p CE/IOL by Dr. Iona Hansen in [phone]   - doing well  - monitor   Ophthalmic Meds Ordered this visit:  No orders of the defined types were placed in this encounter.      Return in about 5 months (around 05/22/2018) for F/U ERm OU, DFE, OCT.  There are no Patient Instructions on file for this visit.   Explained the diagnoses, plan, and follow up with the patient and they expressed understanding.  Patient expressed understanding of the  importance of proper follow up care.   This document serves as a record of services personally performed by Gardiner Sleeper, MD, PhD. It was created on their behalf by Ernest Mallick, OA, an ophthalmic assistant. The creation of this record is the provider's dictation and/or activities during the visit.    Electronically signed by: Ernest Mallick, OA  06.04.2019 8:22 AM    This document serves as a record of services personally performed by Gardiner Sleeper, MD, PhD. It was created on their behalf by Catha Brow, [place], a certified ophthalmic assistant. The creation of this record is the provider's dictation and/or activities during the visit.  Electronically signed by: Catha Brow, COA  06.04.19 8:22 AM    Gardiner Sleeper, M.D., Ph.D. Diseases & Surgery of the Retina and Vitreous [facility]  I have reviewed the above documentation for accuracy and completeness, and I agree with the above. Gardiner Sleeper, M.D., Ph.D. 12/21/17 8:22 AM     Abbreviations: M myopia (nearsighted); A astigmatism; H hyperopia (farsighted); P presbyopia; Mrx spectacle prescription;  CTL contact lenses; OD right eye; OS left eye; OU both eyes  XT exotropia; ET esotropia; PEK punctate epithelial keratitis; PEE punctate epithelial erosions; DES dry eye syndrome; MGD meibomian gland dysfunction; ATs artificial tears; PFAT's preservative free artificial tears; Nelson nuclear sclerotic cataract; PSC posterior subcapsular cataract; ERM epi-retinal membrane; PVD posterior vitreous detachment; RD retinal detachment; DM diabetes mellitus; DR diabetic retinopathy; NPDR non-proliferative diabetic retinopathy; PDR proliferative diabetic retinopathy; CSME clinically significant macular edema; DME diabetic macular edema; dbh dot blot hemorrhages; CWS cotton wool spot; POAG primary open angle glaucoma; C/D cup-to-disc ratio; HVF humphrey visual field; GVF goldmann visual field; OCT optical coherence tomography;  IOP intraocular pressure; BRVO Branch retinal vein occlusion; CRVO central retinal vein occlusion; CRAO central retinal artery occlusion; BRAO branch retinal artery occlusion; RT retinal tear; SB scleral buckle; PPV pars plana vitrectomy; VH Vitreous hemorrhage; PRP panretinal laser photocoagulation; IVK intravitreal kenalog; VMT vitreomacular traction; MH Macular hole;  NVD neovascularization of the disc; NVE neovascularization elsewhere; AREDS age related eye disease study; ARMD age related macular degeneration; POAG primary open angle glaucoma; EBMD epithelial/anterior basement membrane dystrophy; ACIOL anterior chamber intraocular lens; IOL intraocular lens; PCIOL posterior chamber intraocular lens; Phaco/IOL phacoemulsification with intraocular lens placement; [place]all photorefractive keratectomy; LASIK laser assisted in situ keratomileusis; HTN hypertension; DM diabetes mellitus; COPD chronic obstructive pulmonary disease

## 2017-12-21 ENCOUNTER — Encounter (INDEPENDENT_AMBULATORY_CARE_PROVIDER_SITE_OTHER): Payer: Self-pay | Admitting: Ophthalmology

## 2018-01-10 ENCOUNTER — Encounter: Payer: Self-pay | Admitting: Gastroenterology

## 2018-02-27 NOTE — Addendum Note (Signed)
Addended by: Gardiner Sleeper on: 02/27/2018 10:16 PM   Modules accepted: Level of Service

## 2018-05-23 ENCOUNTER — Encounter (INDEPENDENT_AMBULATORY_CARE_PROVIDER_SITE_OTHER): Payer: Medicare Other | Admitting: Ophthalmology

## 2019-07-25 DIAGNOSIS — Z87891 Personal history of nicotine dependence: Secondary | ICD-10-CM | POA: Diagnosis not present

## 2019-07-25 DIAGNOSIS — L989 Disorder of the skin and subcutaneous tissue, unspecified: Secondary | ICD-10-CM | POA: Diagnosis not present

## 2019-07-25 DIAGNOSIS — I5030 Unspecified diastolic (congestive) heart failure: Secondary | ICD-10-CM | POA: Diagnosis not present

## 2019-07-25 DIAGNOSIS — Z299 Encounter for prophylactic measures, unspecified: Secondary | ICD-10-CM | POA: Diagnosis not present

## 2019-07-25 DIAGNOSIS — B0229 Other postherpetic nervous system involvement: Secondary | ICD-10-CM | POA: Diagnosis not present

## 2019-07-25 DIAGNOSIS — G459 Transient cerebral ischemic attack, unspecified: Secondary | ICD-10-CM | POA: Diagnosis not present

## 2019-07-25 DIAGNOSIS — I10 Essential (primary) hypertension: Secondary | ICD-10-CM | POA: Diagnosis not present

## 2019-08-02 DIAGNOSIS — J449 Chronic obstructive pulmonary disease, unspecified: Secondary | ICD-10-CM | POA: Diagnosis not present

## 2019-08-13 DIAGNOSIS — D485 Neoplasm of uncertain behavior of skin: Secondary | ICD-10-CM | POA: Diagnosis not present

## 2019-08-13 DIAGNOSIS — H61009 Unspecified perichondritis of external ear, unspecified ear: Secondary | ICD-10-CM | POA: Diagnosis not present

## 2019-08-16 DIAGNOSIS — I10 Essential (primary) hypertension: Secondary | ICD-10-CM | POA: Diagnosis not present

## 2019-08-16 DIAGNOSIS — E7800 Pure hypercholesterolemia, unspecified: Secondary | ICD-10-CM | POA: Diagnosis not present

## 2019-08-16 DIAGNOSIS — J449 Chronic obstructive pulmonary disease, unspecified: Secondary | ICD-10-CM | POA: Diagnosis not present

## 2019-09-02 DIAGNOSIS — J449 Chronic obstructive pulmonary disease, unspecified: Secondary | ICD-10-CM | POA: Diagnosis not present

## 2019-09-13 DIAGNOSIS — I10 Essential (primary) hypertension: Secondary | ICD-10-CM | POA: Diagnosis not present

## 2019-09-13 DIAGNOSIS — J449 Chronic obstructive pulmonary disease, unspecified: Secondary | ICD-10-CM | POA: Diagnosis not present

## 2019-09-13 DIAGNOSIS — E7800 Pure hypercholesterolemia, unspecified: Secondary | ICD-10-CM | POA: Diagnosis not present

## 2019-09-30 DIAGNOSIS — J449 Chronic obstructive pulmonary disease, unspecified: Secondary | ICD-10-CM | POA: Diagnosis not present

## 2019-10-08 DIAGNOSIS — E7800 Pure hypercholesterolemia, unspecified: Secondary | ICD-10-CM | POA: Diagnosis not present

## 2019-10-08 DIAGNOSIS — I10 Essential (primary) hypertension: Secondary | ICD-10-CM | POA: Diagnosis not present

## 2019-10-08 DIAGNOSIS — J449 Chronic obstructive pulmonary disease, unspecified: Secondary | ICD-10-CM | POA: Diagnosis not present

## 2019-10-23 DIAGNOSIS — Z299 Encounter for prophylactic measures, unspecified: Secondary | ICD-10-CM | POA: Diagnosis not present

## 2019-10-23 DIAGNOSIS — F17210 Nicotine dependence, cigarettes, uncomplicated: Secondary | ICD-10-CM | POA: Diagnosis not present

## 2019-10-23 DIAGNOSIS — J449 Chronic obstructive pulmonary disease, unspecified: Secondary | ICD-10-CM | POA: Diagnosis not present

## 2019-10-23 DIAGNOSIS — I739 Peripheral vascular disease, unspecified: Secondary | ICD-10-CM | POA: Diagnosis not present

## 2019-10-23 DIAGNOSIS — I10 Essential (primary) hypertension: Secondary | ICD-10-CM | POA: Diagnosis not present

## 2019-10-23 DIAGNOSIS — I5030 Unspecified diastolic (congestive) heart failure: Secondary | ICD-10-CM | POA: Diagnosis not present

## 2019-10-26 DIAGNOSIS — I10 Essential (primary) hypertension: Secondary | ICD-10-CM | POA: Diagnosis not present

## 2019-10-26 DIAGNOSIS — E7800 Pure hypercholesterolemia, unspecified: Secondary | ICD-10-CM | POA: Diagnosis not present

## 2019-10-26 DIAGNOSIS — J449 Chronic obstructive pulmonary disease, unspecified: Secondary | ICD-10-CM | POA: Diagnosis not present

## 2019-10-31 DIAGNOSIS — J449 Chronic obstructive pulmonary disease, unspecified: Secondary | ICD-10-CM | POA: Diagnosis not present

## 2019-11-30 DIAGNOSIS — J449 Chronic obstructive pulmonary disease, unspecified: Secondary | ICD-10-CM | POA: Diagnosis not present

## 2019-12-04 DIAGNOSIS — H04123 Dry eye syndrome of bilateral lacrimal glands: Secondary | ICD-10-CM | POA: Diagnosis not present

## 2019-12-04 DIAGNOSIS — H40033 Anatomical narrow angle, bilateral: Secondary | ICD-10-CM | POA: Diagnosis not present

## 2019-12-06 DIAGNOSIS — E559 Vitamin D deficiency, unspecified: Secondary | ICD-10-CM | POA: Diagnosis not present

## 2019-12-06 DIAGNOSIS — E7800 Pure hypercholesterolemia, unspecified: Secondary | ICD-10-CM | POA: Diagnosis not present

## 2019-12-06 DIAGNOSIS — Z299 Encounter for prophylactic measures, unspecified: Secondary | ICD-10-CM | POA: Diagnosis not present

## 2019-12-06 DIAGNOSIS — Z0000 Encounter for general adult medical examination without abnormal findings: Secondary | ICD-10-CM | POA: Diagnosis not present

## 2019-12-06 DIAGNOSIS — F17210 Nicotine dependence, cigarettes, uncomplicated: Secondary | ICD-10-CM | POA: Diagnosis not present

## 2019-12-06 DIAGNOSIS — E039 Hypothyroidism, unspecified: Secondary | ICD-10-CM | POA: Diagnosis not present

## 2019-12-06 DIAGNOSIS — Z79899 Other long term (current) drug therapy: Secondary | ICD-10-CM | POA: Diagnosis not present

## 2019-12-06 DIAGNOSIS — Z1211 Encounter for screening for malignant neoplasm of colon: Secondary | ICD-10-CM | POA: Diagnosis not present

## 2019-12-06 DIAGNOSIS — R5383 Other fatigue: Secondary | ICD-10-CM | POA: Diagnosis not present

## 2019-12-06 DIAGNOSIS — Z7189 Other specified counseling: Secondary | ICD-10-CM | POA: Diagnosis not present

## 2019-12-16 DIAGNOSIS — J449 Chronic obstructive pulmonary disease, unspecified: Secondary | ICD-10-CM | POA: Diagnosis not present

## 2019-12-16 DIAGNOSIS — E7800 Pure hypercholesterolemia, unspecified: Secondary | ICD-10-CM | POA: Diagnosis not present

## 2019-12-16 DIAGNOSIS — I10 Essential (primary) hypertension: Secondary | ICD-10-CM | POA: Diagnosis not present

## 2020-01-17 DEATH — deceased
